# Patient Record
Sex: Female | Born: 1990 | Race: Black or African American | Hispanic: No | Marital: Single | Smoking: Never smoker
Health system: Southern US, Community
[De-identification: ages and names within clinical notes are randomized; demographics above are authoritative.]

## PROBLEM LIST (undated history)

## (undated) DIAGNOSIS — A64 Unspecified sexually transmitted disease: Secondary | ICD-10-CM

## (undated) DIAGNOSIS — O1490 Unspecified pre-eclampsia, unspecified trimester: Secondary | ICD-10-CM

## (undated) HISTORY — DX: Unspecified pre-eclampsia, unspecified trimester: O14.90

## (undated) HISTORY — PX: NO PAST SURGERIES: SHX2092

## (undated) HISTORY — DX: Unspecified sexually transmitted disease: A64

---
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
## 2013-10-13 IMAGING — US US OB US >=[ID] SNGL FETUS
1 series · 14 of 28 positions shown · non-contrast
Comparison: none

REASON FOR EXAM: dating
COMMENTS:

[Series 1: us ob us >=(id) sngl fetus · 0.39mm/px · 14 of 62 slices shown]
[im 3/62]
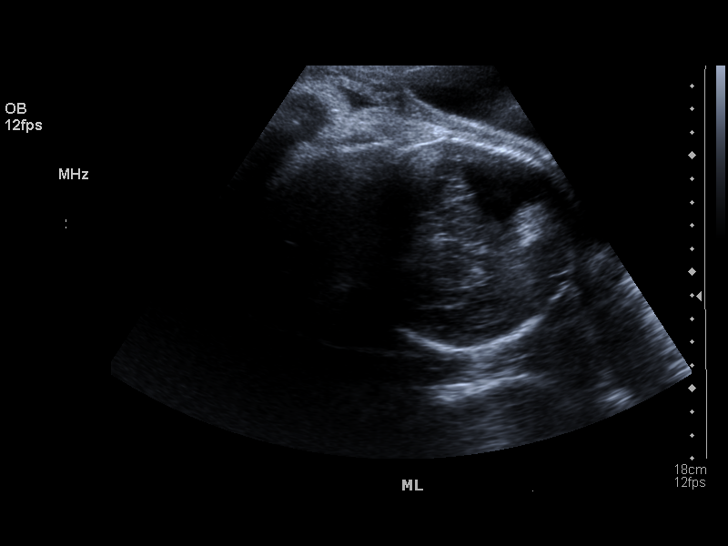
[im 7/62]
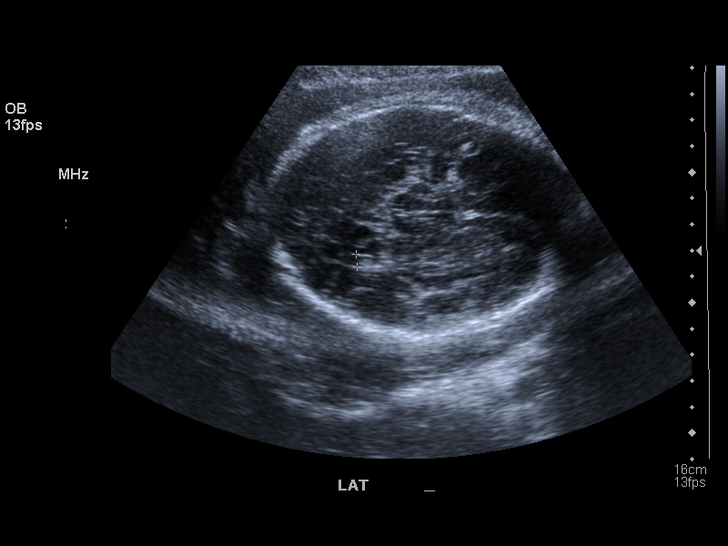
[im 12/62]
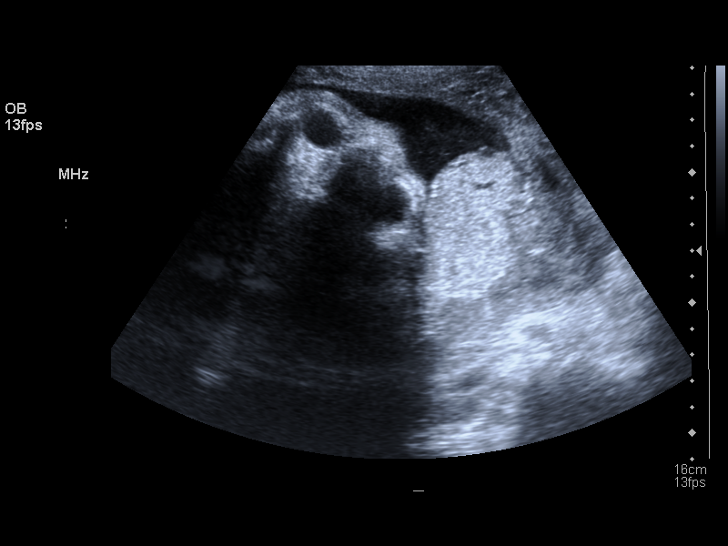
[im 16/62]
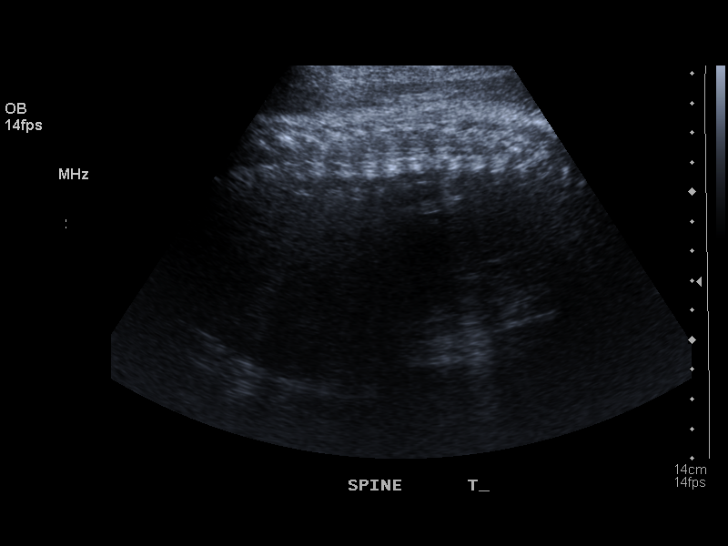
[im 21/62]
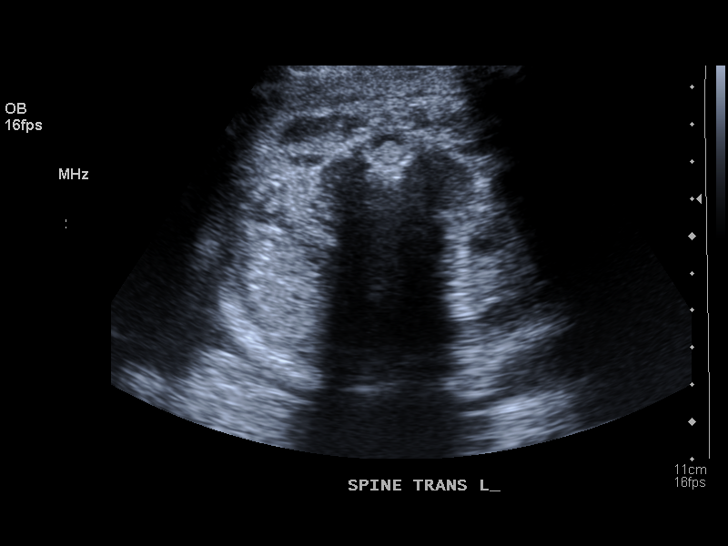
[im 25/62]
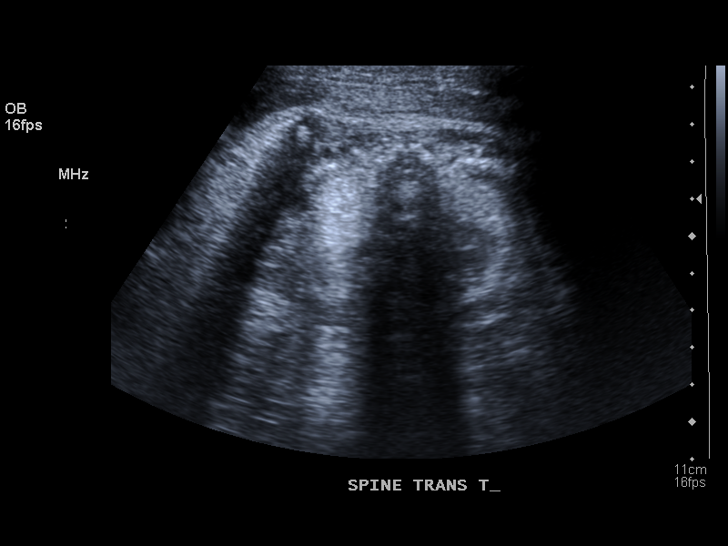
[im 30/62]
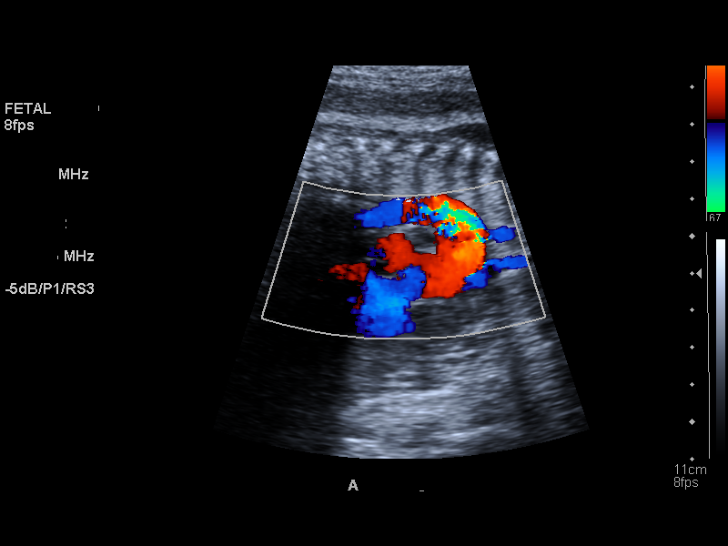
[im 34/62]
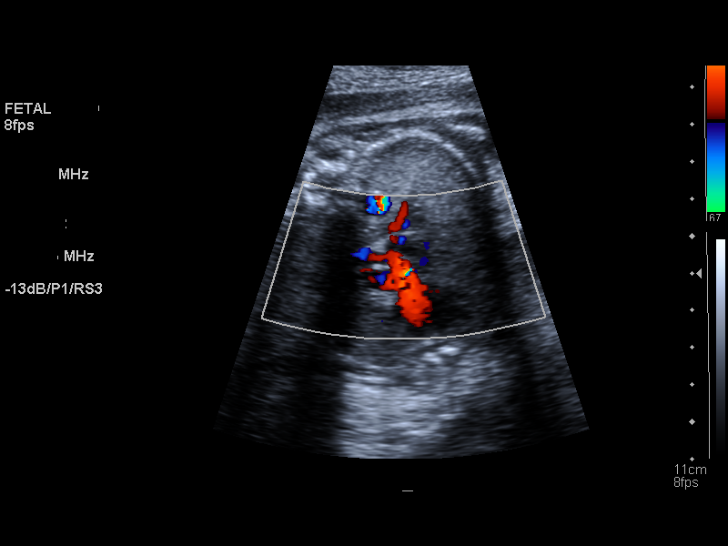
[im 39/62]
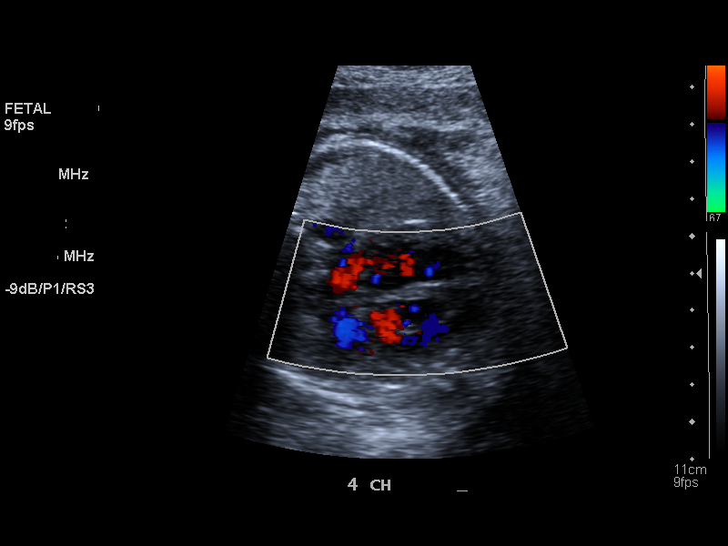
[im 43/62]
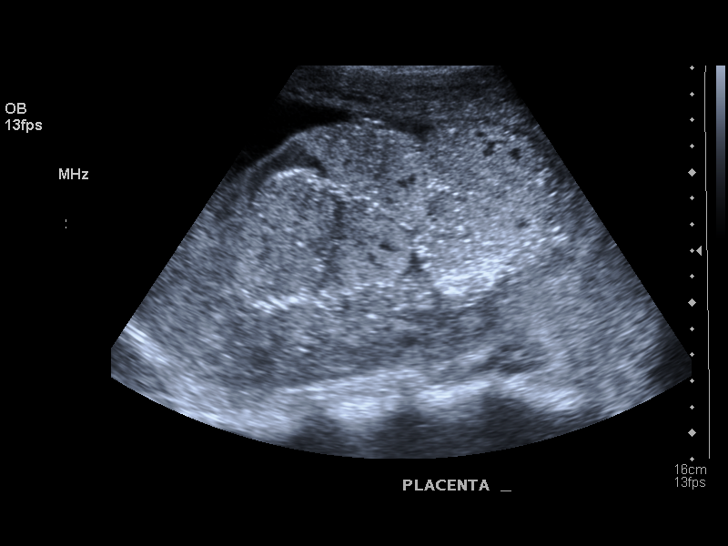
[im 48/62]
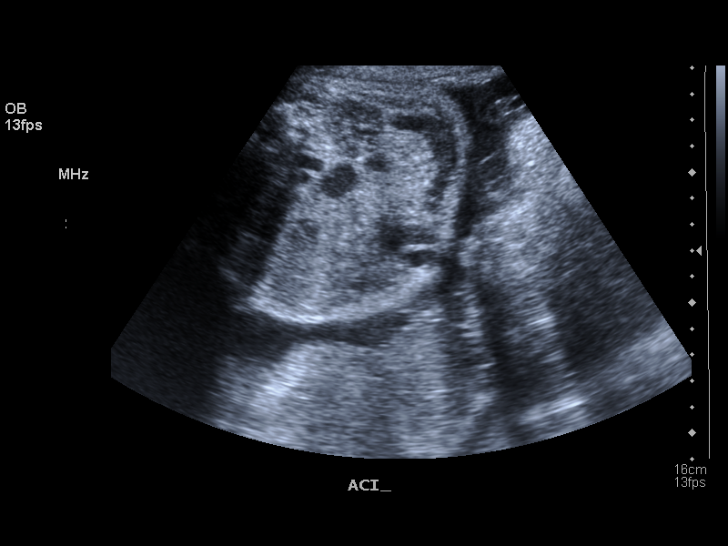
[im 52/62]
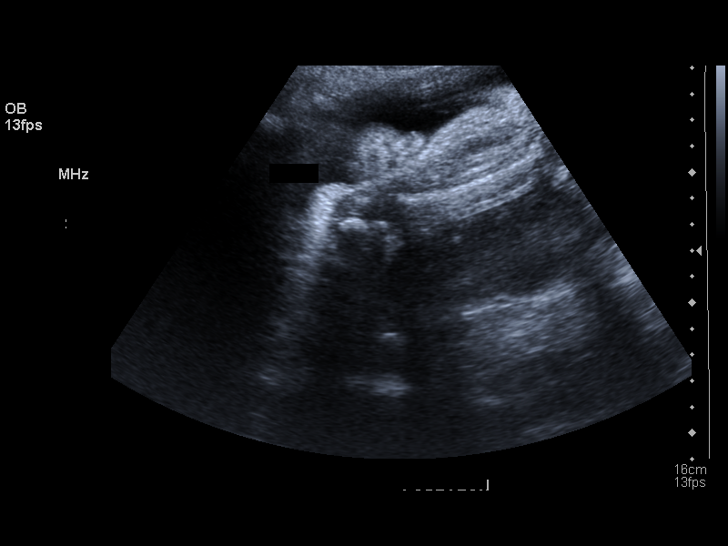
[im 57/62]
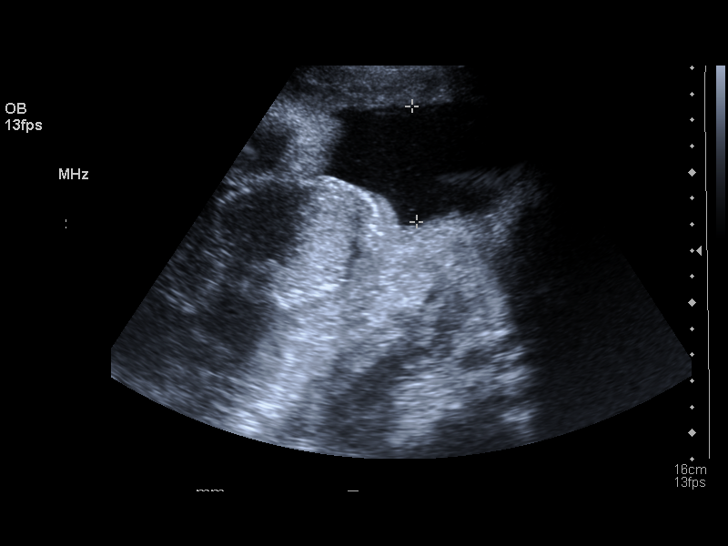
[im 62/62]
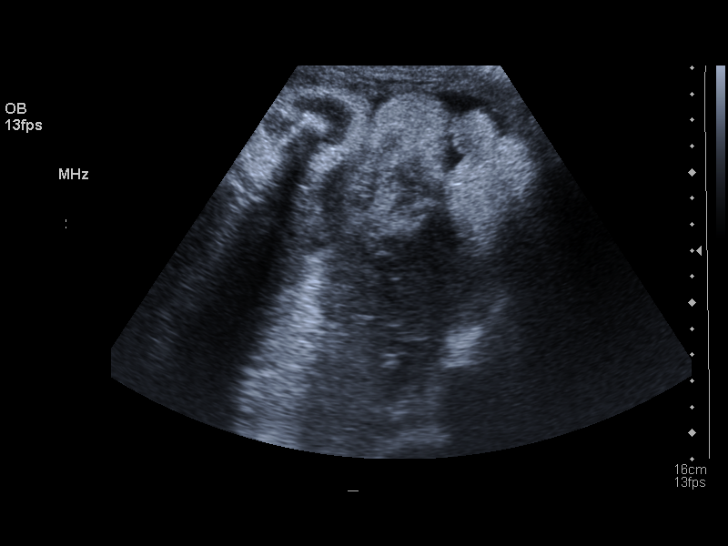

[14 of 28 positions shown; findings below may reference images not displayed]

PROCEDURE:     US  - US OB GREATER/OR EQUAL TO [id number]  - December 03, 2011  [DATE]

RESULT:

OB ultrasound, utilizing routine protocol, shows the cervix is closed and
measures 5.07 cm in length. The distance from the lower margin of the
posterior placenta to the internal cervical os is 9.0 cm. The placenta is
posterior and Grade II in appearance. A single fetus is demonstrated with
trunk, extremity, diaphragmatic and heart motion demonstrated. Fetal heart
rate is 141 beats per minute. Fetal anatomy appears to be within normal
limits without congenital abnormality suggested. Three vessel cord is seen.
Amniotic fluid volume is visually normal with an AFI of 15.0 cm. Current
estimated fetal weight is 2,899 grams + / - 429 grams with an ultrasound EDC
26 December, 2011 and a current gestational age of 36 weeks, 4 days.
IMPRESSION: Single, viable intrauterine fetus as described.

## 2013-10-27 IMAGING — US US OB FOLLOW-UP
1 series · 17 of 28 positions shown · non-contrast
Comparison: none

REASON FOR EXAM: follow up growth
COMMENTS:

[Series 1: us ob follow-up · 17 of 38 slices shown]
[im 1/38]
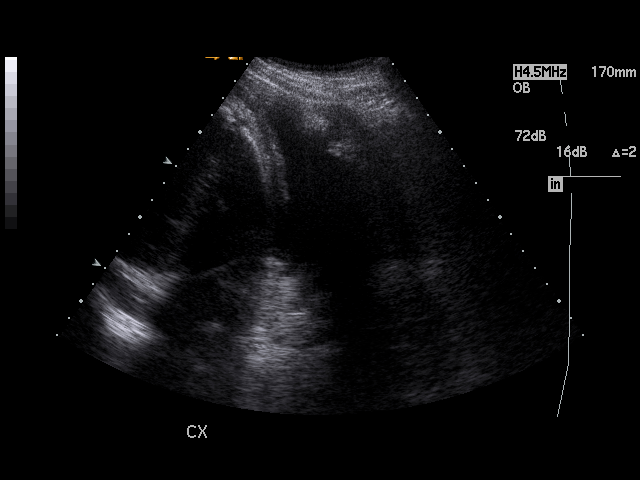
[im 3/38]
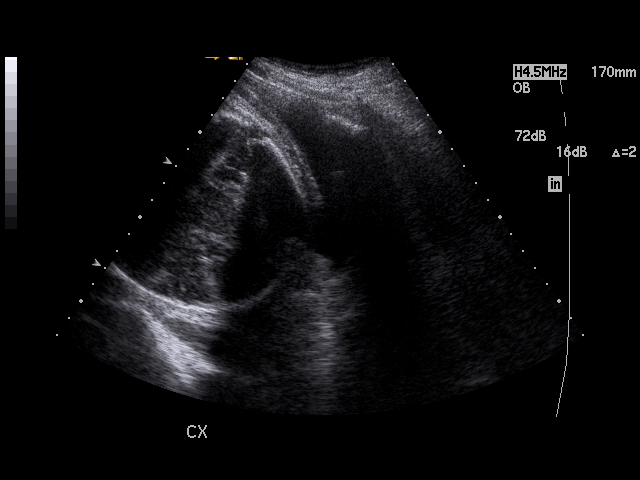
[im 6/38]
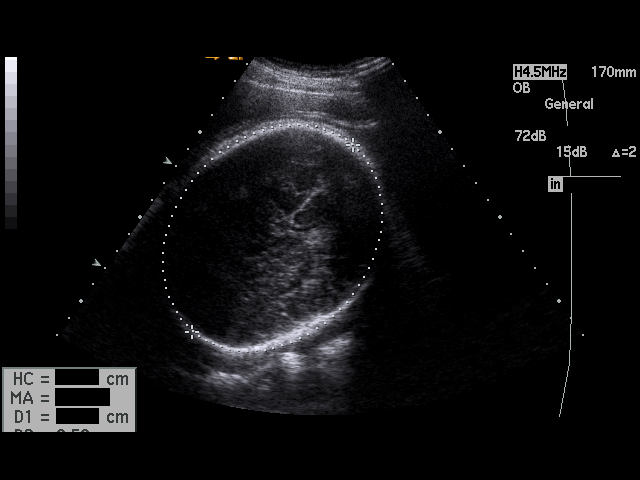
[im 7/38]
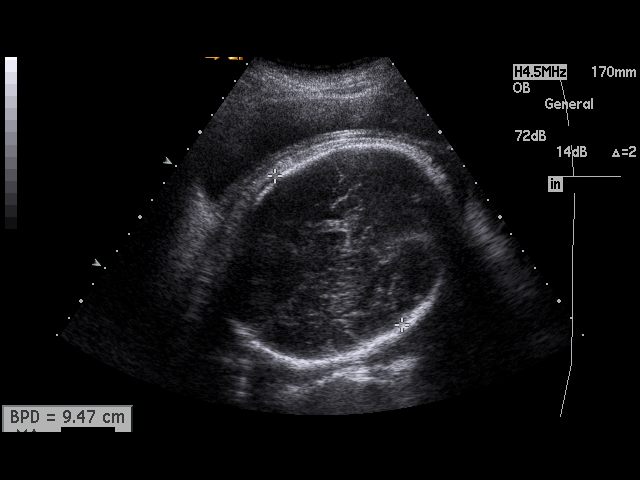
[im 10/38]
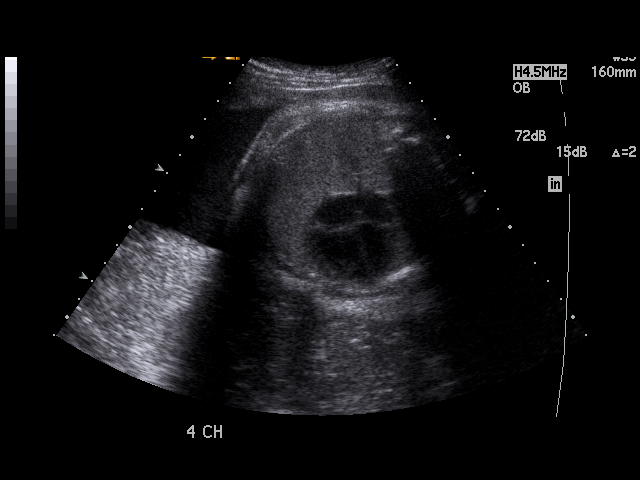
[im 13/38]
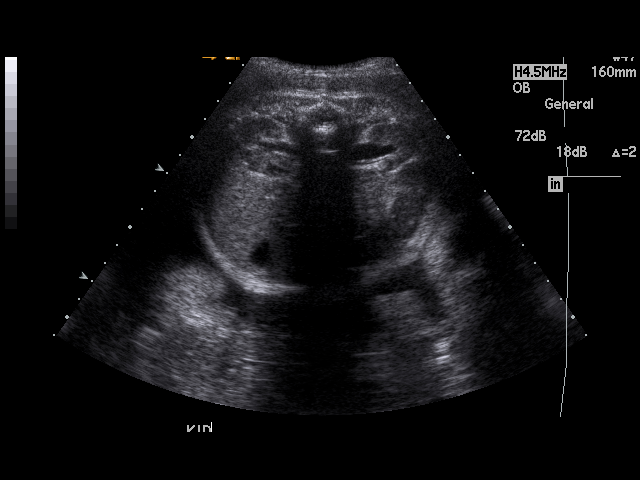
[im 14/38]
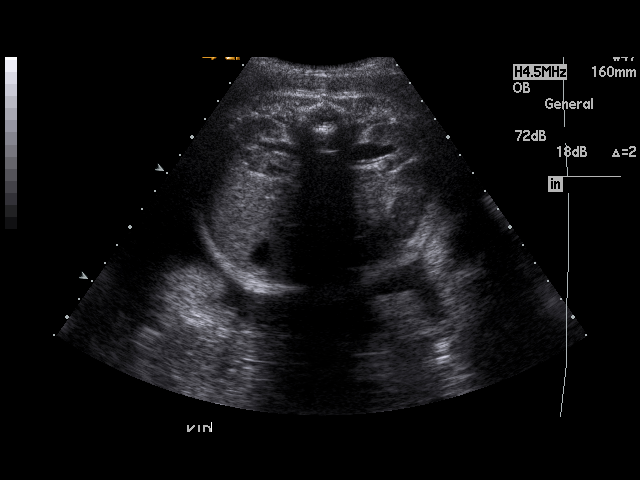
[im 17/38]
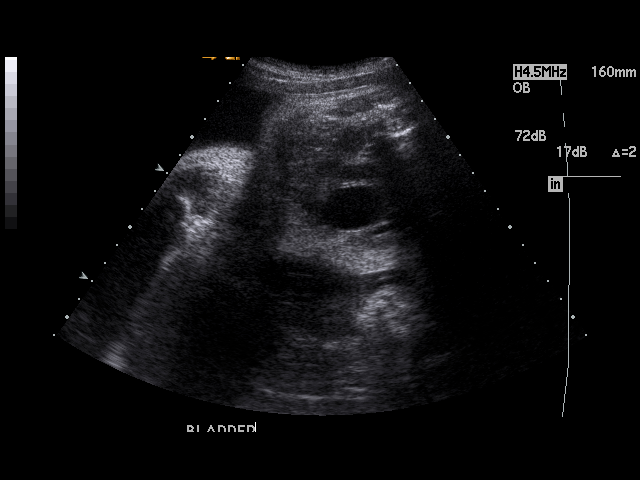
[im 20/38]
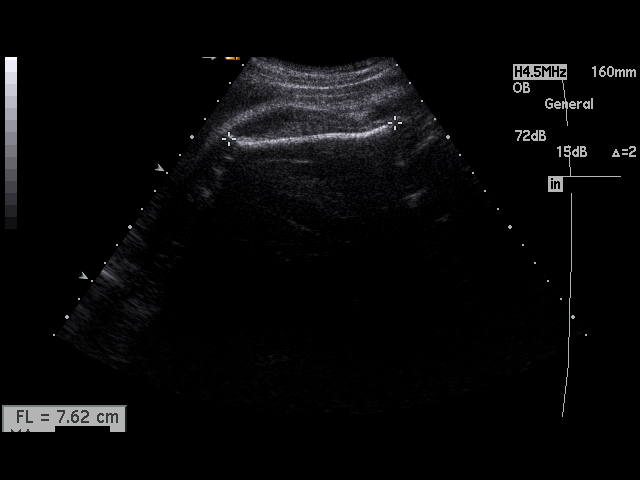
[im 21/38]
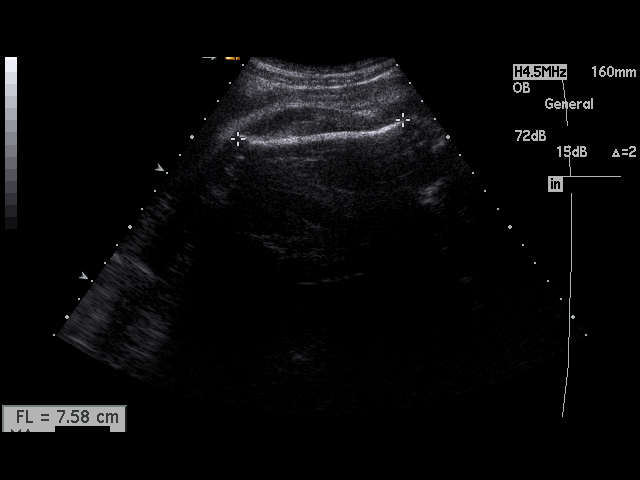
[im 24/38]
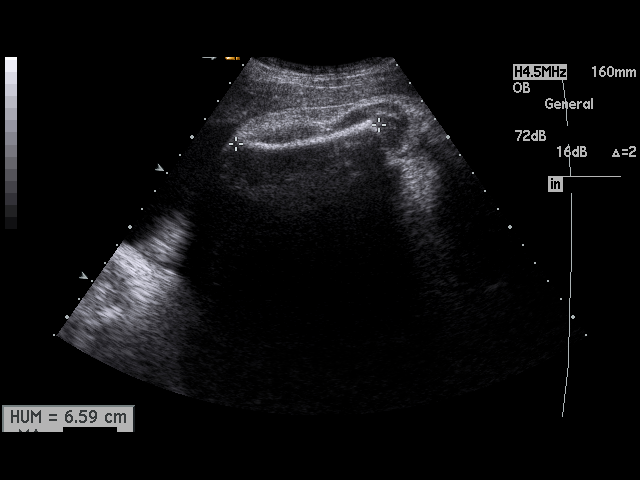
[im 25/38]
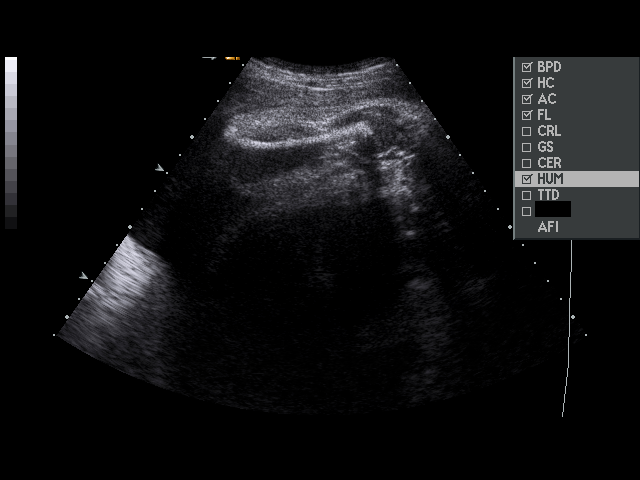
[im 28/38]
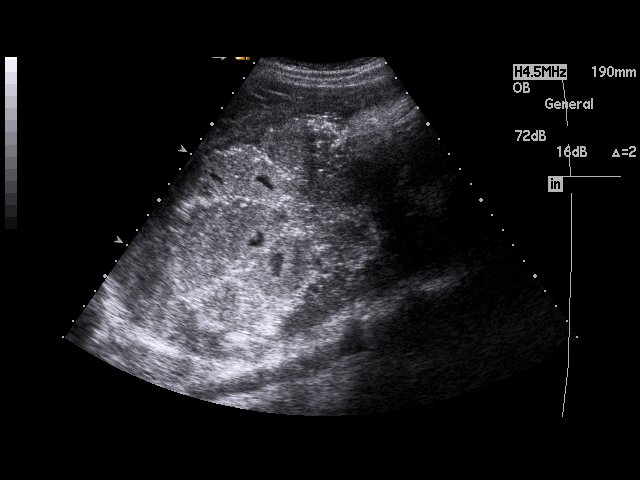
[im 31/38]
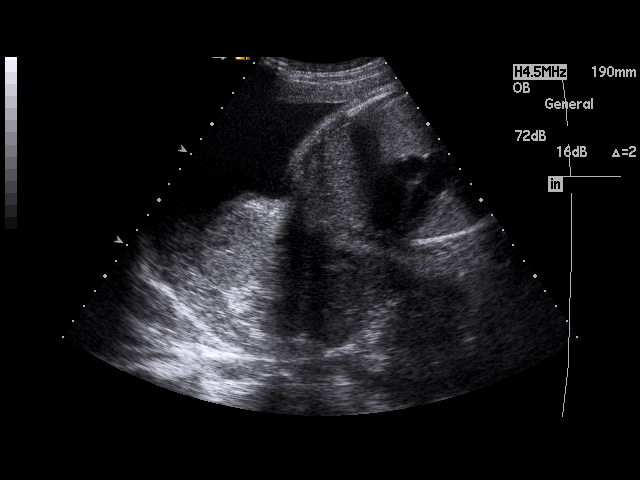
[im 32/38]
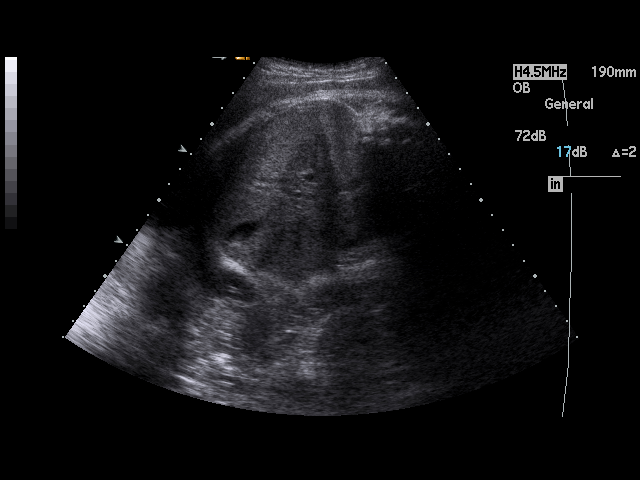
[im 35/38]
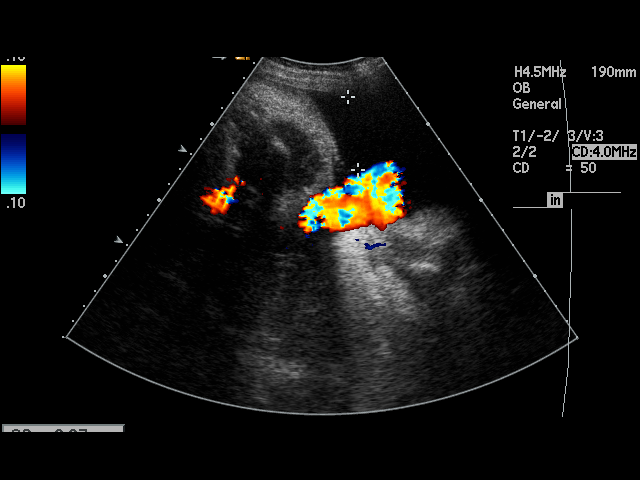
[im 38/38]
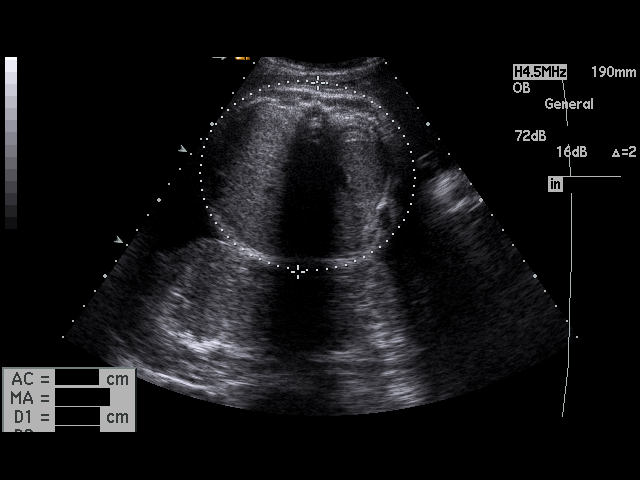

[17 of 28 positions shown; findings below may reference images not displayed]

PROCEDURE:     US  - US OB FOLLOW UP  - December 17, 2011  [DATE]

RESULT:     OB ultrasound is performed. The study shows a single
intrauterine fetus. The length of the cervix is 3.76 cm. The cervix is
closed. The placenta is posterior. The distance from the inferior aspect to
the internal cervical os was not measured. AFI is 13.78 cm. Gestational age
based on the biophysical measurements is 38 weeks 5 days + / - the standard
deviation with an ultrasound EDD December 26, 2011. Current fetal weight is
3,546 grams + / - 411 grams. There is a cephalic presentation. The fetal
bladder, kidneys and stomach are seen and appear to be normal. The placenta
is posterior and grade 3. No placenta previa is evident.
IMPRESSION: 1.     AFI measures 13.78 cm.
2.     Gestational age is 38 weeks 5 days with an ultrasound EDD [DATE]

## 2014-06-30 DIAGNOSIS — A64 Unspecified sexually transmitted disease: Secondary | ICD-10-CM

## 2014-06-30 HISTORY — DX: Unspecified sexually transmitted disease: A64

## 2015-01-22 NOTE — Consult Note (Signed)
Referral Information:   Reason for Referral Hypertension and proteinuria in late pregnancy Poor dating    Referring Physician Kate Sable, MD    Prenatal Hx 24 year old G1 with EDC of 12/26/2011 (39 0/7 weeks). Pregnancy dated by ultrasound performed on 12/03/2011. Then the calculated gestational age was 46 5/7 weeks. Patient has been admitted to the hospital for increased blood pressures. The patient report a previous uncomplicated antenatal course. She does not know the date of her LMP. The patient had her first positive pregnancy test on Feb. 20.  She was first seen for prenatal care on March 2.  On March 19, the patient was admitted for evaluation of elevated BPs of 134-148/86-90. Since her hospitalization, has BP in the same range and has had a systolic BP of 150.  Her lab evaluation has been normal except for  P/C ratio on 3/19 = 2325 (markedly high) P/C ratio on 3/20 = 1406 (confirms previous) Uric acid on 3/20 = 6.9 (extremely high for pregnancy) Creatinine on 3/20 = 0.84 (high for pregnancy) H&H = 9.7 and 28.8 (low)  The patient reports no complaints or signs/symptom of preeclampsia   Home Medications: Medication Instructions Status  Prenatal 1 oral capsule 1 cap(s) orally once a day Active   Allergies:   No Known Allergies:   Vital Signs/Notes:  Nursing Vital Signs: **Vital Signs.:   21-Mar-13 03:16   Vital Signs Type Routine   Temperature Temperature (F) 98.6   Celsius 37   Temperature Source oral   Pulse Pulse 71   Pulse source per Dinamap   Respirations Respirations 18   Systolic BP Systolic BP 137   Diastolic BP (mmHg) Diastolic BP (mmHg) 83   Mean BP 101   BP Source Dinamap   Pulse Ox % Pulse Ox % 98   Pulse Ox Activity Level  At rest   Oxygen Delivery Room Air/ 21 %   Fetal Heart Tones  138    07:19   Vital Signs Type Routine   Temperature Temperature (F) 98   Celsius 36.6   Temperature Source oral   Pulse Pulse 63   Pulse source per Dinamap    Respirations Respirations 18   Systolic BP Systolic BP 128   Diastolic BP (mmHg) Diastolic BP (mmHg) 84   Mean BP 98   BP Source Dinamap   Pulse Ox % Pulse Ox % 98   Pulse Ox Activity Level  At rest   Oxygen Delivery Room Air/ 21 %   Perinatal Consult:   Past Medical History cont'd Ob/Gyn History   Menses -- 10/irreg/7   No STDs   No previous Gyn care Surgery -- none Illness -- none Immunizations -- No recent influenza vaccine    Otherwise up to date  Social History   Tobacco/Alcohol/Drugs -- none   Single   Freshman in college  Fam Hx   Mother with anemia   Father, 2 brothers, 1 sister -- all alive and well  ROS   Corrective lenses   Otherwise all systems negative to review   Impression/Recommendations:   Impression 1) Intrauterine pregnancy at 26 0/7 weeks    (Poor dating) 2) Preeclampsia 3) Poor prenatal care 4) Anemia (I would expect H&H be higher in the setting of preeclampsia)    Recommendations 1) Labor induction 2) Magnesium Sulfate for seizure prophylaxis during induction and first 24 hours postpartum 3) Urine drug screen 4) Hemoglobin electrophoresis (if not done as an outpatient) 5) Iron studies     Total  Time Spent with Patient 30 minutes    >50% of visit spent in couseling/coordination of care yes    Office Use Only [address]  INPT Consult Problem Focused (20 min)   Coding Description: OTHER: Preeclampsia.  Electronic Signatures: Marcelino ScotBrancazio, Minas Bonser (MD)  (Signed 21-Mar-13 09:31)  Authored: Referral, Home Medications, Allergies, Vital Signs/Notes, Consult, Impression, Billing, Coding Description   Last Updated: 21-Mar-13 09:31 by Marcelino ScotBrancazio, Primo Innis (MD)
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
## 2015-04-25 ENCOUNTER — Ambulatory Visit (INDEPENDENT_AMBULATORY_CARE_PROVIDER_SITE_OTHER): Payer: Managed Care, Other (non HMO) | Admitting: Unknown Physician Specialty

## 2015-04-25 ENCOUNTER — Encounter: Payer: Self-pay | Admitting: Unknown Physician Specialty

## 2015-04-25 VITALS — BP 117/75 | HR 82 | Temp 98.5°F | Ht 63.9 in | Wt 120.8 lb

## 2015-04-25 DIAGNOSIS — J029 Acute pharyngitis, unspecified: Secondary | ICD-10-CM

## 2015-04-25 MED ORDER — AZITHROMYCIN 250 MG PO TABS: ORAL_TABLET | ORAL | Status: DC

## 2015-04-25 NOTE — Patient Instructions (Signed)

## 2015-04-25 NOTE — Progress Notes (Signed)
   BP 117/75 mmHg  Pulse 82  Temp(Src) 98.5 F (36.9 C)  Ht 5' 3.9" (1.623 m)  Wt 120 lb 12.8 oz (54.795 kg)  BMI 20.80 kg/m2  SpO2 99%  LMP 04/22/2015 (Exact Date)   Subjective:    Patient ID: Jill Ramos, female    DOB: Mar 22, 1991, 22 y.o.   MRN: [id number]  HPI: Jill Ramos is a 24 y.o. female  Chief Complaint  Patient presents with  . Sore Throat    pt states throat started feelin funny about a week ago but actually started hurting on Saturday   Sore Throat  This is a new problem. The current episode started in the past 7 days. The problem has been unchanged. The pain is worse on the right side. There has been no fever. The pain is moderate. Pertinent negatives include no congestion, coughing, ear pain or trouble swallowing.     Relevant past medical, surgical, family and social history reviewed and updated as indicated. Interim medical history since our last visit reviewed. Allergies and medications reviewed and updated.  Review of Systems  HENT: Negative for congestion, ear pain and trouble swallowing.   Respiratory: Negative for cough.     Per HPI unless specifically indicated above     Objective:    BP 117/75 mmHg  Pulse 82  Temp(Src) 98.5 F (36.9 C)  Ht 5' 3.9" (1.623 m)  Wt 120 lb 12.8 oz (54.795 kg)  BMI 20.80 kg/m2  SpO2 99%  LMP 04/22/2015 (Exact Date)  Wt Readings from Last 3 Encounters:  04/25/15 120 lb 12.8 oz (54.795 kg)  03/12/13 117 lb (53.071 kg)    Physical Exam  Constitutional: She is oriented to person, place, and time. She appears well-developed and well-nourished. No distress.  HENT:  Head: Normocephalic and atraumatic.  Right Ear: Tympanic membrane and ear canal normal.  Left Ear: Tympanic membrane and ear canal normal.  Mouth/Throat: Mucous membranes are normal. Oropharyngeal exudate, posterior oropharyngeal edema and posterior oropharyngeal erythema present. No tonsillar abscesses.  Eyes: Conjunctivae and lids are  normal. Right eye exhibits no discharge. Left eye exhibits no discharge. No scleral icterus.  Cardiovascular: Normal rate and regular rhythm.   Pulmonary/Chest: Effort normal. No respiratory distress.  Abdominal: Normal appearance. There is no splenomegaly or hepatomegaly.  Musculoskeletal: Normal range of motion.  Neurological: She is alert and oriented to person, place, and time.  Skin: Skin is intact. No rash noted. No pallor.  Psychiatric: She has a normal mood and affect. Her behavior is normal. Judgment and thought content normal.  Vitals reviewed.    Assessment & Plan:   Problem List Items Addressed This Visit    None    Visit Diagnoses    Pharyngitis    -  Primary    Relevant Orders    Strep Gp A Ag, IA W/Reflex        Follow up plan: Return if symptoms worsen or fail to improve.

## 2015-04-27 LAB — STREP GP A AG, IA W/REFLEX: Strep A Culture: NEGATIVE

## 2017-01-03 ENCOUNTER — Other Ambulatory Visit: Payer: Self-pay | Admitting: Certified Nurse Midwife

## 2017-02-03 ENCOUNTER — Other Ambulatory Visit: Payer: Self-pay | Admitting: Certified Nurse Midwife

## 2017-02-04 ENCOUNTER — Telehealth: Payer: Self-pay | Admitting: Certified Nurse Midwife

## 2017-02-04 MED ORDER — NORETHIN ACE-ETH ESTRAD-FE 1-20 MG-MCG PO TABS: 1.0000 | ORAL_TABLET | Freq: Every day | ORAL | 1 refills | Status: DC

## 2017-02-04 NOTE — Telephone Encounter (Signed)
Pt aware rx sent in. 

## 2017-02-04 NOTE — Telephone Encounter (Signed)
Patient called and scheduled her annual for June 14th, however she will need a refill on her Birth Control to last her until then.   Ph[facility] [facility] [address] [place] [phone]

## 2017-03-13 ENCOUNTER — Ambulatory Visit (INDEPENDENT_AMBULATORY_CARE_PROVIDER_SITE_OTHER): Payer: 59 | Admitting: Certified Nurse Midwife

## 2017-03-13 ENCOUNTER — Encounter: Payer: Self-pay | Admitting: Certified Nurse Midwife

## 2017-03-13 VITALS — BP 100/60 | HR 88 | Ht 63.0 in | Wt 125.0 lb

## 2017-03-13 DIAGNOSIS — Z113 Encounter for screening for infections with a predominantly sexual mode of transmission: Secondary | ICD-10-CM | POA: Diagnosis not present

## 2017-03-13 DIAGNOSIS — Z3041 Encounter for surveillance of contraceptive pills: Secondary | ICD-10-CM

## 2017-03-13 DIAGNOSIS — Z01419 Encounter for gynecological examination (general) (routine) without abnormal findings: Secondary | ICD-10-CM

## 2017-03-13 DIAGNOSIS — Z124 Encounter for screening for malignant neoplasm of cervix: Secondary | ICD-10-CM | POA: Diagnosis not present

## 2017-03-13 MED ORDER — LEVONORGESTREL-ETHINYL ESTRAD 0.15-30 MG-MCG PO TABS: 1.0000 | ORAL_TABLET | Freq: Every day | ORAL | 11 refills | Status: DC

## 2017-03-13 NOTE — Progress Notes (Signed)
Gynecology Annual Exam  PCP: Gabriel CirriWicker, Cheryl, NP  Chief Complaint:  Chief Complaint  Patient presents with  . Gynecologic Exam    History of Present Illness:Jill Ramos is a 26 year old African American/Black female, G1 P1001, who presents for her annual exam. She is having problems with BTB on her pills.   Her menses are regular and her LMP was 02/28/2017. They occur every 1 month, they last 5-6 days, are medium flow, and are without clots.   When she misses pills, she will start bleeding and continue spotting until she is through her placebo pills.  She denies dysmenorrhea.  The patient's past medical history is notable for a history of being treated for Chlamydia. Since her last annual GYN exam dated 11/10/2015 , she has had no significant changes in her health history.  She is sexually active. She is currently using Junel 1/20 and condoms for contraception.  Her last Pap smear 11/10/15 was negative.  Mammogram is not applicable.  There is no family history of breast cancer.  There is no family history of ovarian cancer.  The patient does do monthly self breast exams.  The patient does not smoke.  The patient does not drink alcohol.  The patient does not use illegal drugs.  The patient exercises regularly by walking  The patient does get adequate calcium in her diet.  She has not had a recent cholesterol screen and is not interested in labwork.   The patient denies current symptoms of depression.    Review of Systems: Review of Systems  Constitutional: Negative for chills, fever and weight loss.  HENT: Negative for congestion, sinus pain and sore throat.   Eyes: Negative for blurred vision and pain.  Respiratory: Negative for hemoptysis, shortness of breath and wheezing.   Cardiovascular: Negative for chest pain, palpitations and leg swelling.  Gastrointestinal: Negative for abdominal pain, blood in stool, diarrhea, heartburn, nausea and vomiting.  Genitourinary:  Negative for dysuria, frequency, hematuria and urgency.  Musculoskeletal: Negative for back pain, joint pain and myalgias.  Skin: Negative for itching and rash.  Neurological: Negative for dizziness, tingling and headaches.  Endo/Heme/Allergies: Negative for environmental allergies and polydipsia. Does not bruise/bleed easily.       Negative for hirsutism   Psychiatric/Behavioral: Negative for depression. The patient is not nervous/anxious and does not have insomnia.     Past Medical History:  Past Medical History:  Diagnosis Date  . Preeclampsia   . STD (sexually transmitted disease) 06/2014   chlamydia    Past Surgical History:  Past Surgical History:  Procedure Laterality Date  . NO PAST SURGERIES      Family History:  Family History  Problem Relation Age of Onset  . Diabetes Maternal Grandmother   . Hypertension Maternal Grandmother   . Diabetes Paternal Grandmother   . Heart disease Paternal Grandfather   . Stomach cancer Cousin     Social History:  Social History   Social History  . Marital status: Single    Spouse name: N/A  . Number of children: 1  . Years of education: N/A   Occupational History  . Transport plannerales Manager    Social History Main Topics  . Smoking status: Never Smoker  . Smokeless tobacco: Never Used  . Alcohol use No  . Drug use: No  . Sexual activity: Yes    Partners: Male    Birth control/ protection: Pill   Other Topics Concern  . Not on file  Social History Narrative  . No narrative on file    Allergies:  No Known Allergies  Medications: Prior to Admission medications   Medication Sig Start Date End Date Taking? Authorizing Provider  norethindrone-ethinyl estradiol (JUNEL FE 1/20) 1-20 MG-MCG tablet Take 1 tablet by mouth daily. 02/04/17  Yes Farrel Conners, CNM    Physical Exam Vitals: Blood pressure 100/60, pulse 88, height 5\' 3"  (1.6 m), weight 125 lb (56.7 kg), last menstrual period 02/28/2017.  General: NAD HEENT:  normocephalic, anicteric Neck: no thyroid enlargement, no palpable nodules, no cervical lymphadenopathy  Pulmonary: No increased work of breathing, CTAB Cardiovascular: RRR, without murmur  Breast: Breast symmetrical, no tenderness, no palpable nodules or masses, no skin or nipple retraction present, no nipple discharge.  No axillary, infraclavicular or supraclavicular lymphadenopathy. Abdomen: Soft, non-tender, non-distended.  Umbilicus without lesions.  No hepatomegaly or masses palpable. No evidence of hernia. Genitourinary:  External: Normal external female genitalia.  Normal urethral meatus, normal Bartholin's and Skene's glands.    Vagina: Normal vaginal mucosa, no evidence of prolapse.    Cervix: Grossly normal in appearance, no bleeding, non-tender  Uterus: Anteflexeded, normal size, shape, and consistency, mobile, and non-tender  Adnexa: No adnexal masses, non-tender  Rectal: deferred  Lymphatic: no evidence of inguinal lymphadenopathy Extremities: no edema, erythema, or tenderness Neurologic: Grossly intact Psychiatric: mood appropriate, affect full     Assessment: 26 y.o. G1P1001 well woman exam BTB on pills  Discussed finding a better way or time to take the pill so she is not forgetting  Will switch to another pill-Portia-and see if it is a little more forgiving. Has been off the pills for a couple weeks (ran out). Discussed using condoms x 2 weeks  Plan:  1) Breast cancer screening - recommend monthly self breast exam.   2) STI screening was offered and accepted.  3) Cervical cancer screening - Pap was done. ASCCP guidelines and rational discussed.  Patient opts for yearly screening interval  4) Routine healthcare maintenance including cholesterol and diabetes screening declined   Farrel Conners, CNM

## 2017-03-16 LAB — PAP IG, CT-NG, RFX HPV ALL
Chlamydia, Nuc. Acid Amp: NEGATIVE
Gonococcus by Nucleic Acid Amp: NEGATIVE
PAP SMEAR COMMENT: 0

## 2017-03-17 ENCOUNTER — Encounter: Payer: Self-pay | Admitting: Certified Nurse Midwife

## 2017-03-17 DIAGNOSIS — Z8759 Personal history of other complications of pregnancy, childbirth and the puerperium: Secondary | ICD-10-CM | POA: Insufficient documentation

## 2017-03-17 DIAGNOSIS — Z309 Encounter for contraceptive management, unspecified: Secondary | ICD-10-CM | POA: Insufficient documentation

## 2017-04-03 ENCOUNTER — Other Ambulatory Visit: Payer: Self-pay | Admitting: Certified Nurse Midwife

## 2017-05-14 ENCOUNTER — Telehealth: Payer: Self-pay | Admitting: Unknown Physician Specialty

## 2017-05-14 NOTE — Telephone Encounter (Signed)
Patient dropped off a form for Jill MaxwellCheryl to fill out.  I put in Jill Ramos's basket but if she needs an appointment for this let me know.  Thank you  [phone]

## 2017-05-14 NOTE — Telephone Encounter (Signed)
Patient needs to be seen. She has not been seen in 2 years.

## 2017-05-15 NOTE — Telephone Encounter (Signed)
Called patient to get appointment scheduled.  VM was full could not leave a message.  I will try to reach her again.

## 2018-01-01 ENCOUNTER — Other Ambulatory Visit: Payer: Self-pay | Admitting: Certified Nurse Midwife

## 2018-03-25 ENCOUNTER — Other Ambulatory Visit: Payer: Self-pay | Admitting: Certified Nurse Midwife

## 2018-05-17 NOTE — Progress Notes (Signed)
Gynecology Annual Exam  PCP: Gabriel CirriWicker, Cheryl, NP  Chief Complaint:  Chief Complaint  Patient presents with  . Gynecologic Exam    period starts on 4th day of plecebos - nl? okay?    History of Present Illness:Jill Ramos is a 27 year old African American/Black female, G1 P1001, who presents for her annual exam. She is wondering about her delay in starting her menses until the 4th day of placebos. She was advised that this OK.   Her menses are regular and her LMP was 11 August 19 . They occur every 1 month, they last 6-7days, are medium flow, and are without clots. She reports that she does not start her new pack of pills the day after her last placebo pill. She has been waiting until her bleeding stops before starting her new pill pack. She occasionally has dysmenorrhea but does not usually need any medication for pain.  The patient's past medical history is notable for a history of being treated for Chlamydia. Since her last annual GYN exam dated 03/13/17 , she has had no significant changes in her health history. 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 She is sexually active. She is currently using Lillow OCPs and condoms for contraception.  Her last Pap smear 03/13/2017 was negative.  Mammogram is not applicable.  There is no family history of breast cancer.  There is no family history of ovarian cancer.  The patient does do monthly self breast exams.  The patient does not smoke.  The patient does not drink alcohol.  The patient does not use illegal drugs.  The patient exercises occasionally by walking  The patient does get adequate calcium in her diet.  She has not had a recent cholesterol screen and is not interested in labwork.   The patient denies current symptoms of depression.    Review of Systems: Review of Systems  Constitutional: Negative for chills, fever and weight loss.  HENT: Negative for congestion, sinus pain and sore  throat.   Eyes: Negative for blurred vision and pain.  Respiratory: Negative for hemoptysis, shortness of breath and wheezing.   Cardiovascular: Negative for chest pain, palpitations and leg swelling.  Gastrointestinal: Negative for abdominal pain, blood in stool, diarrhea, heartburn, nausea and vomiting.  Genitourinary: Negative for dysuria, frequency, hematuria and urgency.  Musculoskeletal: Negative for back pain, joint pain and myalgias.  Skin: Negative for itching and rash.  Neurological: Negative for dizziness, tingling and headaches.  Endo/Heme/Allergies: Negative for environmental allergies and polydipsia. Does not bruise/bleed easily.       Negative for hirsutism   Psychiatric/Behavioral: Negative for depression. The patient is not nervous/anxious and does not have insomnia.     Past Medical History:  Past Medical History:  Diagnosis Date  . Preeclampsia   . STD (sexually transmitted disease) 06/2014   chlamydia    Past Surgical History:  Past Surgical History:  Procedure Laterality Date  . NO PAST SURGERIES      Family History:  Family History  Problem Relation Age of Onset  . Diabetes Maternal Grandmother   . Hypertension Maternal Grandmother   . Diabetes Paternal Grandmother   . Heart disease Paternal Grandfather   . Stomach cancer Cousin     Social History:  Social History   Socioeconomic History  . Marital status: Single    Spouse name: Not on file  . Number of children: 1  . Years of education: 1916  .  Highest education level: Not on file  Occupational History  . Occupation: case worker    Comment: [employer]  Social Needs  . Financial resource strain: Not on file  . Food insecurity:    Worry: Not on file    Inability: Not on file  . Transportation needs:    Medical: Not on file    Non-medical: Not on file  Tobacco Use  . Smoking status: Never Smoker  . Smokeless tobacco: Never Used  Substance and Sexual Activity  . Alcohol use: No     Alcohol/week: 0.0 standard drinks  . Drug use: No  . Sexual activity: Yes    Partners: Male    Birth control/protection: Pill  Lifestyle  . Physical activity:    Days per week: Not on file    Minutes per session: Not on file  . Stress: Not on file  Relationships  . Social connections:    Talks on phone: Not on file    Gets together: Not on file    Attends religious service: Not on file    Active member of club or organization: Not on file    Attends meetings of clubs or organizations: Not on file    Relationship status: Not on file  . Intimate partner violence:    Fear of current or ex partner: Not on file    Emotionally abused: Not on file    Physically abused: Not on file    Forced sexual activity: Not on file  Other Topics Concern  . Not on file  Social History Narrative  . Not on file    Allergies:  No Known Allergies  Medications:  Current Outpatient Medications:  .  levonorgestrel-ethinyl estradiol (LILLOW) 0.15-30 MG-MCG tablet, Take 1 tablet by mouth daily., Disp: 84 tablet, Rfl: 3   Physical Exam Vitals:BP 110/70   Pulse 72   Ht 5\' 3"  (1.6 m)   Wt 135 lb 4 oz (61.3 kg)   LMP 05/10/2018   BMI 23.96 kg/m     General: BF in NAD HEENT: normocephalic, anicteric Neck: no thyroid enlargement, no palpable nodules, no cervical lymphadenopathy  Pulmonary: No increased work of breathing, CTAB Cardiovascular: RRR, without murmur  Breast: Breast symmetrical, no tenderness, no palpable nodules or masses, no skin or nipple retraction present, no nipple discharge.  No axillary, infraclavicular or supraclavicular lymphadenopathy. Abdomen: Soft, non-tender, non-distended.  Umbilicus without lesions.  No hepatomegaly or masses palpable. No evidence of hernia. Genitourinary:  External: Normal external female genitalia.  Normal urethral meatus, normal Bartholin's and Skene's glands.    Vagina: Normal vaginal mucosa, no evidence of prolapse.    Cervix: Grossly normal in  appearance, no bleeding, non-tender  Uterus: Anteflexeded, dextrorotated, normal size, shape, and consistency, mobile, and non-tender  Adnexa: No adnexal masses, non-tender  Rectal: deferred  Lymphatic: no evidence of inguinal lymphadenopathy Extremities: no edema, erythema, or tenderness Neurologic: Grossly intact Psychiatric: mood appropriate, affect full     Assessment: 27 y.o. G1P1001 well woman exam Not taking pills correctly   Plan:  1) Breast cancer screening - recommend monthly self breast exam.   2) STI screening was offered and accepted.  3) Cervical cancer screening - Pap was done. ASCCP guidelines and rational discussed.  Patient opts for yearly screening interval  4) Routine healthcare maintenance including cholesterol and diabetes screening declined   5) Refill Lillow #3packs/RF x 3  6) RTO 1 year and prn.  Farrel Connersolleen Tomasa Dobransky, CNM

## 2018-05-18 ENCOUNTER — Ambulatory Visit (INDEPENDENT_AMBULATORY_CARE_PROVIDER_SITE_OTHER): Payer: 59 | Admitting: Certified Nurse Midwife

## 2018-05-18 ENCOUNTER — Encounter: Payer: Self-pay | Admitting: Certified Nurse Midwife

## 2018-05-18 ENCOUNTER — Other Ambulatory Visit (HOSPITAL_COMMUNITY)
Admission: RE | Admit: 2018-05-18 | Discharge: 2018-05-18 | Disposition: A | Discharge status: Home / Self Care | Payer: 59 | Source: Ambulatory Visit | Attending: Certified Nurse Midwife | Admitting: Certified Nurse Midwife

## 2018-05-18 VITALS — BP 110/70 | HR 72 | Ht 63.0 in | Wt 135.2 lb

## 2018-05-18 DIAGNOSIS — Z113 Encounter for screening for infections with a predominantly sexual mode of transmission: Secondary | ICD-10-CM | POA: Insufficient documentation

## 2018-05-18 DIAGNOSIS — Z3041 Encounter for surveillance of contraceptive pills: Secondary | ICD-10-CM

## 2018-05-18 DIAGNOSIS — Z124 Encounter for screening for malignant neoplasm of cervix: Secondary | ICD-10-CM | POA: Diagnosis present

## 2018-05-18 DIAGNOSIS — Z01419 Encounter for gynecological examination (general) (routine) without abnormal findings: Secondary | ICD-10-CM

## 2018-05-18 MED ORDER — LEVONORGESTREL-ETHINYL ESTRAD 0.15-30 MG-MCG PO TABS: 1.0000 | ORAL_TABLET | Freq: Every day | ORAL | 3 refills | Status: DC

## 2018-05-20 LAB — CYTOLOGY - PAP
Chlamydia: NEGATIVE
Diagnosis: NEGATIVE
Neisseria Gonorrhea: NEGATIVE

## 2019-03-29 ENCOUNTER — Other Ambulatory Visit: Payer: Self-pay | Admitting: Certified Nurse Midwife

## 2019-05-23 NOTE — Progress Notes (Signed)
Gynecology Annual Exam  PCP: Kathrine Haddock, NP  Chief Complaint:  Chief Complaint  Patient presents with  . Gynecologic Exam    History of Present Illness:Jill Ramos is a 28 year old African American/Black female, G1 P1001, who presents for her annual exam.    Her menses are regular and her LMP was 07 May 2019. They occur every 1 month, they last 4-5 days, are medium flow, and are without clots. She reports her menses has shortened since starting to take the pills correctly (was waiting to start a new pack till her bleeding stopped). She denies dysmenorrhea. The patient's past medical history is notable for a history of being treated for Chlamydia. Since her last annual GYN exam dated 05/18/2018 , she has had no significant changes in her health history. 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 She is sexually active. She is currently using [place] OCPs for contraception.  Her last Pap smear 05/18/2018 was negative.  Mammogram is not applicable.  There is no family history of breast cancer.  There is no family history of ovarian cancer.  The patient does do monthly self breast exams.  The patient does not smoke.  The patient does not drink alcohol.  The patient does not use illegal drugs.  The patient exercises occasionally by walking 30 minutes four times a week.  The patient does get adequate calcium in her diet.  She has not had a recent cholesterol screen and is not interested in labwork.   The patient denies current symptoms of depression.    Review of Systems: Review of Systems  Constitutional: Negative for chills, fever and weight loss.  HENT: Negative for congestion, sinus pain and sore throat.   Eyes: Negative for blurred vision and pain.  Respiratory: Negative for hemoptysis, shortness of breath and wheezing.   Cardiovascular: Negative for chest pain, palpitations and leg swelling.  Gastrointestinal: Negative for abdominal pain,  blood in stool, diarrhea, heartburn, nausea and vomiting.  Genitourinary: Negative for dysuria, frequency, hematuria and urgency.  Musculoskeletal: Negative for back pain, joint pain and myalgias.  Skin: Negative for itching and rash.  Neurological: Negative for dizziness, tingling and headaches.  Endo/Heme/Allergies: Negative for environmental allergies and polydipsia. Does not bruise/bleed easily.       Negative for hirsutism   Psychiatric/Behavioral: Negative for depression. The patient is not nervous/anxious and does not have insomnia.     Past Medical History:  Past Medical History:  Diagnosis Date  . Preeclampsia   . STD (sexually transmitted disease) 06/2014   chlamydia    Past Surgical History:  Past Surgical History:  Procedure Laterality Date  . NO PAST SURGERIES      Family History:  Family History  Problem Relation Age of Onset  . Diabetes Maternal Grandmother   . Hypertension Maternal Grandmother   . Diabetes Paternal Grandmother   . Heart disease Paternal Grandfather   . Stomach cancer Cousin     Social History:  Social History   Socioeconomic History  . Marital status: Single    Spouse name: Not on file  . Number of children: 1  . Years of education: 35  . Highest education level: Not on file  Occupational History  . Occupation: case worker    Comment: [employer]  Social Needs  . Financial resource strain: Not on file  . Food insecurity    Worry: Not on file    Inability: Not on file  .  Transportation needs    Medical: Not on file    Non-medical: Not on file  Tobacco Use  . Smoking status: Never Smoker  . Smokeless tobacco: Never Used  Substance and Sexual Activity  . Alcohol use: No    Alcohol/week: 0.0 standard drinks  . Drug use: No  . Sexual activity: Yes    Partners: Male    Birth control/protection: Pill  Lifestyle  . Physical activity    Days per week: Not on file    Minutes per session: Not on file  . Stress: Not on file   Relationships  . Social Musicianconnections    Talks on phone: Not on file    Gets together: Not on file    Attends religious service: Not on file    Active member of club or organization: Not on file    Attends meetings of clubs or organizations: Not on file    Relationship status: Not on file  . Intimate partner violence    Fear of current or ex partner: Not on file    Emotionally abused: Not on file    Physically abused: Not on file    Forced sexual activity: Not on file  Other Topics Concern  . Not on file  Social History Narrative  . Not on file    Allergies:  No Known Allergies  Medications:  Current Outpatient Medications:  .  LILLOW 0.15-30 MG-MCG tablet, TAKE 1 TABLET BY MOUTH EVERY DAY, Disp: 84 tablet, Rfl: 0   Physical Exam Vitals:BP 110/70   Pulse 82   Ht 5\' 3"  (1.6 m)   Wt 144 lb 6.4 oz (65.5 kg)   LMP 05/07/2019 (Exact Date)   BMI 25.58 kg/m     General: BF in NAD HEENT: normocephalic, anicteric Neck: no thyroid enlargement, no palpable nodules, no cervical lymphadenopathy  Pulmonary: No increased work of breathing, CTAB Cardiovascular: RRR, without murmur  Breast: Breast symmetrical, no tenderness. There is some nodularity in the inner parts of both breasts, but no dominant masses. No skin or nipple retraction present, no nipple discharge.  No axillary, infraclavicular or supraclavicular lymphadenopathy. Abdomen: Soft, non-tender, non-distended.  Umbilicus without lesions.  No hepatomegaly or masses palpable. No evidence of hernia. Genitourinary:  External: Normal external female genitalia.  Normal urethral meatus, normal Bartholin's and Skene's glands.    Vagina: Normal vaginal mucosa, no evidence of prolapse, white creamy discharge.    Cervix: everted, no bleeding, non-tender  Uterus: Anteflexed, normal size, shape, and consistency, mobile, and non-tender  Adnexa: No adnexal masses, non-tender  Rectal: deferred  Lymphatic: no evidence of inguinal  lymphadenopathy Extremities: no edema, erythema, or tenderness Neurologic: Grossly intact Psychiatric: mood appropriate, affect full     Assessment: 28 y.o. G1P1001 well woman exam    Plan:  1) Breast cancer screening - recommend monthly self breast exam.   2) STI screening was offered and accepted.  3) Cervical cancer screening - Pap was done. ASCCP guidelines and rational discussed.  Patient opts for yearly screening interval  4) Routine healthcare maintenance including cholesterol and diabetes screening declined   5) Refill Lillow #3packs/RF x 3  6) RTO 1 year and prn.  Farrel Connersolleen Raeshawn Tafolla, CNM

## 2019-05-24 ENCOUNTER — Other Ambulatory Visit: Payer: Self-pay

## 2019-05-24 ENCOUNTER — Other Ambulatory Visit (HOSPITAL_COMMUNITY)
Admission: RE | Admit: 2019-05-24 | Discharge: 2019-05-24 | Disposition: A | Discharge status: Home / Self Care | Payer: Managed Care, Other (non HMO) | Source: Ambulatory Visit | Attending: Certified Nurse Midwife | Admitting: Certified Nurse Midwife

## 2019-05-24 ENCOUNTER — Ambulatory Visit (INDEPENDENT_AMBULATORY_CARE_PROVIDER_SITE_OTHER): Payer: 59 | Admitting: Certified Nurse Midwife

## 2019-05-24 ENCOUNTER — Encounter: Payer: Self-pay | Admitting: Certified Nurse Midwife

## 2019-05-24 VITALS — BP 110/70 | HR 82 | Ht 63.0 in | Wt 144.4 lb

## 2019-05-24 DIAGNOSIS — Z01411 Encounter for gynecological examination (general) (routine) with abnormal findings: Secondary | ICD-10-CM | POA: Diagnosis present

## 2019-05-24 DIAGNOSIS — Z124 Encounter for screening for malignant neoplasm of cervix: Secondary | ICD-10-CM | POA: Diagnosis present

## 2019-05-24 DIAGNOSIS — Z113 Encounter for screening for infections with a predominantly sexual mode of transmission: Secondary | ICD-10-CM | POA: Diagnosis present

## 2019-05-24 DIAGNOSIS — Z01419 Encounter for gynecological examination (general) (routine) without abnormal findings: Secondary | ICD-10-CM | POA: Diagnosis not present

## 2019-05-24 DIAGNOSIS — Z3041 Encounter for surveillance of contraceptive pills: Secondary | ICD-10-CM

## 2019-05-24 MED ORDER — LEVONORGESTREL-ETHINYL ESTRAD 0.15-30 MG-MCG PO TABS: 1.0000 | ORAL_TABLET | Freq: Every day | ORAL | 3 refills | Status: DC

## 2019-05-25 LAB — CYTOLOGY - PAP
Chlamydia: NEGATIVE
Diagnosis: NEGATIVE
Neisseria Gonorrhea: NEGATIVE

## 2019-09-20 ENCOUNTER — Telehealth: Payer: Self-pay | Admitting: Unknown Physician Specialty

## 2019-09-20 ENCOUNTER — Ambulatory Visit: Payer: Self-pay | Admitting: *Deleted

## 2019-09-20 NOTE — Telephone Encounter (Signed)
Pt tested at [facility] on 12/15 and got a positive result today. Pt has not been at [facility] in over 4 yrs, so not a pt anymore.  Pt works for [employer], and needs a letter stating CDC guidelines and what is recommended for her to quarantine.     Pt requesting a letter stating that she is positive for her job and how long she would need to be in quarantine. Her test results are not in Epic. She was tested at [facility].  Advised her to check with [facility] to see what lab they used and to try to get a copy of her results from them. She voiced understanding.

## 2019-09-20 NOTE — Telephone Encounter (Signed)
error 

## 2020-03-27 ENCOUNTER — Encounter: Payer: Self-pay | Admitting: Physician Assistant

## 2020-03-27 ENCOUNTER — Ambulatory Visit: Payer: Managed Care, Other (non HMO) | Admitting: Physician Assistant

## 2020-03-27 ENCOUNTER — Other Ambulatory Visit: Payer: Self-pay

## 2020-03-27 VITALS — BP 128/74 | HR 91 | Temp 99.6°F | Resp 16 | Ht 63.0 in | Wt 147.0 lb

## 2020-03-27 DIAGNOSIS — S01311A Laceration without foreign body of right ear, initial encounter: Secondary | ICD-10-CM

## 2020-03-27 MED ORDER — MUPIROCIN 2 % EX OINT: 1.0000 "application " | TOPICAL_OINTMENT | Freq: Three times a day (TID) | CUTANEOUS | 0 refills | Status: DC

## 2020-03-27 NOTE — Progress Notes (Signed)
   Subjective:    Patient ID: Jill Ramos, female    DOB: 10-02-1990, 29 y.o.   MRN: [id number]  HPI    Review of Systems     Objective:   Physical Exam        Assessment & Plan:

## 2020-03-27 NOTE — Progress Notes (Signed)
   Subjective:    Patient ID: Jill Ramos, female    DOB: 21-Aug-1991, 29 y.o.   MRN: [id number]  HPI 29 yo F presents concerned about bruised earlobe noticed in mirror this morning.. Wondering if it is a spider bite ? No awareness of insect.  On questioning  wore new large hoop earrings over the weekend with a jeans jacket w collar. Not aware of an injury but did flip collar multiple times. Was not dancing. Uses cell phone to ear. Denies pain   Review of Systems As above    Objective:   Physical Exam Vitals and nursing note reviewed.  Constitutional:      Appearance: Normal appearance.  HENT:     Head: Normocephalic.     Comments: Right earlobe with dependant bruising  Pierced earring hole has been slightly elongated with new laceration evident  approx 2 mm inferiorly      Right Ear: Tympanic membrane and ear canal normal.     Left Ear: Tympanic membrane, ear canal and external ear normal.     Nose: Nose normal.  Eyes:     Extraocular Movements: Extraocular movements intact.  Neurological:     Mental Status: She is alert.       Assessment & Plan:  Laceration, minor, right earlobe pierced earring tract extended inferiorly Dependant bruising earlobe  Plan Mupirocin ointment massaged into wound. Also surgical steel or 10/14 K gold post coated with mupirocin placed in piercing canal for a week- rotate daily - may remove and replace immediately daily after 3 days  Caution wire posts in future

## 2020-06-08 ENCOUNTER — Ambulatory Visit: Payer: 59 | Admitting: Obstetrics and Gynecology

## 2020-06-14 NOTE — Patient Instructions (Signed)
I value your feedback and entrusting us with your care. If you get a [facility] patient survey, I would appreciate you taking the time to let us know about your experience today. Thank you!  As of September 09, 2019, your lab results will be released to your MyChart immediately, before I even have a chance to see them. Please give me time to review them and contact you if there are any abnormalities. Thank you for your patience.  

## 2020-06-14 NOTE — Progress Notes (Signed)
PCP:  Patient, No Pcp Per   Chief Complaint  Patient presents with  . Gynecologic Exam     HPI:      Ms. Jill Ramos is a 29 y.o. G1P1001 whose LMP was Patient's last menstrual period was 06/09/2020 (exact date)., presents today for her annual examination.  Her menses are regular every 28-30 days, lasting 4 days.  Dysmenorrhea none. She does not have intermenstrual bleeding.  Sex activity: single partner, contraception - OCP (estrogen/progesterone).  Last Pap: 05/24/19  Results were: no abnormalities  Hx of STDs: chlamydia  There is no FH of breast cancer. There is no FH of ovarian cancer. The patient does do self-breast exams.  Tobacco use: The patient denies current or previous tobacco use. Alcohol use: none No drug use.  Exercise: moderately active  She does get adequate calcium and Vitamin D in her diet. Did 1 of Gardasil but never did other 2. Declines for now.   Upstream - 06/15/20 1012      Pregnancy Intention Screening   Does the patient want to become pregnant in the next year? No    Does the patient's partner want to become pregnant in the next year? No    Would the patient like to discuss contraceptive options today? Yes      Contraception Wrap Up   Current Method Oral Contraceptive          The pregnancy intention screening data noted above was reviewed. Potential methods of contraception were discussed. The patient elected to proceed with Oral Contraceptive.    Past Medical History:  Diagnosis Date  . Preeclampsia   . STD (sexually transmitted disease) 06/2014   chlamydia    Past Surgical History:  Procedure Laterality Date  . NO PAST SURGERIES      Family History  Problem Relation Age of Onset  . Diabetes Maternal Grandmother   . Hypertension Maternal Grandmother   . Diabetes Paternal Grandmother   . Heart disease Paternal Grandfather   . Stomach cancer Cousin     Social History   Socioeconomic History  . Marital status: Single     Spouse name: Not on file  . Number of children: 1  . Years of education: 34  . Highest education level: Not on file  Occupational History  . Occupation: case worker    Comment: [employer]  Tobacco Use  . Smoking status: Never Smoker  . Smokeless tobacco: Never Used  Vaping Use  . Vaping Use: Never used  Substance and Sexual Activity  . Alcohol use: No    Alcohol/week: 0.0 standard drinks  . Drug use: No  . Sexual activity: Yes    Partners: Male    Birth control/protection: Pill  Other Topics Concern  . Not on file  Social History Narrative  . Not on file   Social Determinants of Health   Financial Resource Strain:   . Difficulty of Paying Living Expenses: Not on file  Food Insecurity:   . Worried About Programme researcher, broadcasting/film/video in the Last Year: Not on file  . Ran Out of Food in the Last Year: Not on file  Transportation Needs:   . Lack of Transportation (Medical): Not on file  . Lack of Transportation (Non-Medical): Not on file  Physical Activity:   . Days of Exercise per Week: Not on file  . Minutes of Exercise per Session: Not on file  Stress:   . Feeling of Stress : Not on file  Social Connections:   .  Frequency of Communication with Friends and Family: Not on file  . Frequency of Social Gatherings with Friends and Family: Not on file  . Attends Religious Services: Not on file  . Active Member of Clubs or Organizations: Not on file  . Attends Banker Meetings: Not on file  . Marital Status: Not on file  Intimate Partner Violence:   . Fear of Current or Ex-Partner: Not on file  . Emotionally Abused: Not on file  . Physically Abused: Not on file  . Sexually Abused: Not on file     Current Outpatient Medications:  .  levonorgestrel-ethinyl estradiol (LILLOW) 0.15-30 MG-MCG tablet, Take 1 tablet by mouth daily., Disp: 84 tablet, Rfl: 3     ROS:  Review of Systems  Constitutional: Negative for fatigue, fever and unexpected weight change.    Respiratory: Negative for cough, shortness of breath and wheezing.   Cardiovascular: Negative for chest pain, palpitations and leg swelling.  Gastrointestinal: Negative for blood in stool, constipation, diarrhea, nausea and vomiting.  Endocrine: Negative for cold intolerance, heat intolerance and polyuria.  Genitourinary: Negative for dyspareunia, dysuria, flank pain, frequency, genital sores, hematuria, menstrual problem, pelvic pain, urgency, vaginal bleeding, vaginal discharge and vaginal pain.  Musculoskeletal: Negative for back pain, joint swelling and myalgias.  Skin: Negative for rash.  Neurological: Negative for dizziness, syncope, light-headedness, numbness and headaches.  Hematological: Negative for adenopathy.  Psychiatric/Behavioral: Negative for agitation, confusion, sleep disturbance and suicidal ideas. The patient is not nervous/anxious.   BREAST: No symptoms   Objective: BP 120/90   Ht 5\' 3"  (1.6 m)   Wt 147 lb (66.7 kg)   LMP 06/09/2020 (Exact Date)   BMI 26.04 kg/m    Physical Exam Constitutional:      Appearance: She is well-developed.  Genitourinary:     Vulva, vagina, cervix, uterus, right adnexa and left adnexa normal.     No vulval lesion or tenderness noted.     No vaginal discharge, erythema or tenderness.     No cervical polyp.     Uterus is not enlarged or tender.     No right or left adnexal mass present.     Right adnexa not tender.     Left adnexa not tender.  Neck:     Thyroid: No thyromegaly.  Cardiovascular:     Rate and Rhythm: Normal rate and regular rhythm.     Heart sounds: Normal heart sounds. No murmur heard.   Pulmonary:     Effort: Pulmonary effort is normal.     Breath sounds: Normal breath sounds.  Chest:     Breasts:        Right: No mass, nipple discharge, skin change or tenderness.        Left: No mass, nipple discharge, skin change or tenderness.  Abdominal:     Palpations: Abdomen is soft.     Tenderness: There is no  abdominal tenderness. There is no guarding.  Musculoskeletal:        General: Normal range of motion.     Cervical back: Normal range of motion.  Neurological:     General: No focal deficit present.     Mental Status: She is alert and oriented to person, place, and time.     Cranial Nerves: No cranial nerve deficit.  Skin:    General: Skin is warm and dry.  Psychiatric:        Mood and Affect: Mood normal.        Behavior:  Behavior normal.        Thought Content: Thought content normal.        Judgment: Judgment normal.  Vitals reviewed.     Assessment/Plan: Encounter for annual routine gynecological examination  Screening for STD (sexually transmitted disease) - Plan: Cervicovaginal ancillary only  Encounter for surveillance of contraceptive pills - Plan: levonorgestrel-ethinyl estradiol (LILLOW) 0.15-30 MG-MCG tablet  Meds ordered this encounter  Medications  . levonorgestrel-ethinyl estradiol (LILLOW) 0.15-30 MG-MCG tablet    Sig: Take 1 tablet by mouth daily.    Dispense:  84 tablet    Refill:  3    Order Specific Question:   Supervising Provider    Answer:   Nadara Mustard [[id number]]             GYN counsel adequate intake of calcium and vitamin D, diet and exercise, Gardasil     F/U  Return in about 1 year (around 06/15/2021).  Sheza Strickland B. Rubee Vega, PA-C 06/15/2020 10:40 AM

## 2020-06-15 ENCOUNTER — Other Ambulatory Visit (HOSPITAL_COMMUNITY)
Admission: RE | Admit: 2020-06-15 | Discharge: 2020-06-15 | Disposition: A | Discharge status: Home / Self Care | Payer: Managed Care, Other (non HMO) | Source: Ambulatory Visit | Attending: Obstetrics and Gynecology | Admitting: Obstetrics and Gynecology

## 2020-06-15 ENCOUNTER — Ambulatory Visit (INDEPENDENT_AMBULATORY_CARE_PROVIDER_SITE_OTHER): Payer: Managed Care, Other (non HMO) | Admitting: Obstetrics and Gynecology

## 2020-06-15 ENCOUNTER — Other Ambulatory Visit: Payer: Self-pay

## 2020-06-15 ENCOUNTER — Encounter: Payer: Self-pay | Admitting: Obstetrics and Gynecology

## 2020-06-15 VITALS — BP 120/90 | Ht 63.0 in | Wt 147.0 lb

## 2020-06-15 DIAGNOSIS — Z113 Encounter for screening for infections with a predominantly sexual mode of transmission: Secondary | ICD-10-CM | POA: Insufficient documentation

## 2020-06-15 DIAGNOSIS — Z3041 Encounter for surveillance of contraceptive pills: Secondary | ICD-10-CM | POA: Diagnosis not present

## 2020-06-15 DIAGNOSIS — Z01419 Encounter for gynecological examination (general) (routine) without abnormal findings: Secondary | ICD-10-CM | POA: Diagnosis not present

## 2020-06-15 MED ORDER — LEVONORGESTREL-ETHINYL ESTRAD 0.15-30 MG-MCG PO TABS: 1.0000 | ORAL_TABLET | Freq: Every day | ORAL | 3 refills | Status: DC

## 2020-06-16 LAB — CERVICOVAGINAL ANCILLARY ONLY
Chlamydia: NEGATIVE
Comment: NEGATIVE
Comment: NORMAL
Neisseria Gonorrhea: NEGATIVE

## 2021-05-25 ENCOUNTER — Telehealth: Payer: Self-pay | Admitting: Obstetrics and Gynecology

## 2021-05-25 DIAGNOSIS — Z3041 Encounter for surveillance of contraceptive pills: Secondary | ICD-10-CM

## 2021-05-25 NOTE — Telephone Encounter (Signed)
Attempted to reach via phone. Voicemail is full unable to leave message

## 2021-05-25 NOTE — Telephone Encounter (Signed)
Patient is scheduled for 06/25/21 with ABC

## 2021-06-21 NOTE — Progress Notes (Signed)
PCP:  Patient, No Pcp Per (Inactive)   Chief Complaint  Patient presents with   Gynecologic Exam    No concerns     HPI:      Ms. Jill Ramos is a 30 y.o. G1P1001 whose LMP was Patient's last menstrual period was 05/31/2021 (approximate)., presents today for her annual examination.  Her menses are regular every 28-30 days, lasting 4 days on OCPs.  Dysmenorrhea none. She does not have intermenstrual bleeding.  Sex activity: single partner, contraception - OCP (estrogen/progesterone).  Last Pap: 05/24/19  Results were: no abnormalities  Hx of STDs: chlamydia  There is no FH of breast cancer. There is no FH of ovarian cancer. The patient does do self-breast exams.  Tobacco use: The patient denies current or previous tobacco use. Alcohol use: none No drug use.  Exercise: moderately active  She does get adequate calcium and Vitamin D in her diet. Did 1 of Gardasil but never did other 2. Declines for now. Needs TdAP  Past Medical History:  Diagnosis Date   Preeclampsia    STD (sexually transmitted disease) 06/2014   chlamydia    Past Surgical History:  Procedure Laterality Date   NO PAST SURGERIES      Family History  Problem Relation Age of Onset   Diabetes Maternal Grandmother    Hypertension Maternal Grandmother    Diabetes Paternal Grandmother    Heart disease Paternal Grandfather    Stomach cancer Cousin     Social History   Socioeconomic History   Marital status: Single    Spouse name: Not on file   Number of children: 1   Years of education: 16   Highest education level: Not on file  Occupational History   Occupation: case Financial controller    Comment: [employer]  Tobacco Use   Smoking status: Never   Smokeless tobacco: Never  Vaping Use   Vaping Use: Never used  Substance and Sexual Activity   Alcohol use: No    Alcohol/week: 0.0 standard drinks   Drug use: No   Sexual activity: Yes    Partners: Male    Birth control/protection: Pill  Other Topics  Concern   Not on file  Social History Narrative   Not on file   Social Determinants of Health   Financial Resource Strain: Not on file  Food Insecurity: Not on file  Transportation Needs: Not on file  Physical Activity: Not on file  Stress: Not on file  Social Connections: Not on file  Intimate Partner Violence: Not on file     Current Outpatient Medications:    levonorgestrel-ethinyl estradiol (ALTAVERA) 0.15-30 MG-MCG tablet, Take 1 tablet by mouth daily., Disp: 84 tablet, Rfl: 3     ROS:  Review of Systems  Constitutional:  Negative for fatigue, fever and unexpected weight change.  Respiratory:  Negative for cough, shortness of breath and wheezing.   Cardiovascular:  Negative for chest pain, palpitations and leg swelling.  Gastrointestinal:  Negative for blood in stool, constipation, diarrhea, nausea and vomiting.  Endocrine: Negative for cold intolerance, heat intolerance and polyuria.  Genitourinary:  Negative for dyspareunia, dysuria, flank pain, frequency, genital sores, hematuria, menstrual problem, pelvic pain, urgency, vaginal bleeding, vaginal discharge and vaginal pain.  Musculoskeletal:  Negative for back pain, joint swelling and myalgias.  Skin:  Negative for rash.  Neurological:  Negative for dizziness, syncope, light-headedness, numbness and headaches.  Hematological:  Negative for adenopathy.  Psychiatric/Behavioral:  Negative for agitation, confusion, sleep disturbance and suicidal  ideas. The patient is not nervous/anxious.  BREAST: No symptoms   Objective: BP 130/84   Ht 5\' 3"  (1.6 m)   Wt 156 lb (70.8 kg)   LMP 05/31/2021 (Approximate)   BMI 27.63 kg/m    Physical Exam Constitutional:      Appearance: She is well-developed.  Genitourinary:     Vulva normal.     Right Labia: No rash, tenderness or lesions.    Left Labia: No tenderness, lesions or rash.    No vaginal discharge, erythema or tenderness.      Right Adnexa: not tender and no mass  present.    Left Adnexa: not tender and no mass present.    No cervical friability or polyp.     Uterus is not enlarged or tender.  Breasts:    Right: No mass, nipple discharge, skin change or tenderness.     Left: No mass, nipple discharge, skin change or tenderness.  Neck:     Thyroid: No thyromegaly.  Cardiovascular:     Rate and Rhythm: Normal rate and regular rhythm.     Heart sounds: Normal heart sounds. No murmur heard. Pulmonary:     Effort: Pulmonary effort is normal.     Breath sounds: Normal breath sounds.  Abdominal:     Palpations: Abdomen is soft.     Tenderness: There is no abdominal tenderness. There is no guarding or rebound.  Musculoskeletal:        General: Normal range of motion.     Cervical back: Normal range of motion.  Lymphadenopathy:     Cervical: No cervical adenopathy.  Neurological:     General: No focal deficit present.     Mental Status: She is alert and oriented to person, place, and time.     Cranial Nerves: No cranial nerve deficit.  Skin:    General: Skin is warm and dry.  Psychiatric:        Mood and Affect: Mood normal.        Behavior: Behavior normal.        Thought Content: Thought content normal.        Judgment: Judgment normal.  Vitals reviewed.    Assessment/Plan: Encounter for annual routine gynecological examination  Encounter for surveillance of contraceptive pills - Plan: levonorgestrel-ethinyl estradiol (ALTAVERA) 0.15-30 MG-MCG tablet; OCP RF  Meds ordered this encounter  Medications   levonorgestrel-ethinyl estradiol (ALTAVERA) 0.15-30 MG-MCG tablet    Sig: Take 1 tablet by mouth daily.    Dispense:  84 tablet    Refill:  3    Order Specific Question:   Supervising Provider    Answer:   02[birth year]2004 Nadara Mustard             GYN counsel adequate intake of calcium and vitamin D, diet and exercise, Gardasil     F/U  Return in about 1 year (around 06/25/2022).  Leba Tibbitts B. Jenasia Dolinar, PA-C 06/25/2021 11:16 AM

## 2021-06-25 ENCOUNTER — Encounter: Payer: Self-pay | Admitting: Obstetrics and Gynecology

## 2021-06-25 ENCOUNTER — Ambulatory Visit (INDEPENDENT_AMBULATORY_CARE_PROVIDER_SITE_OTHER): Payer: Managed Care, Other (non HMO) | Admitting: Obstetrics and Gynecology

## 2021-06-25 ENCOUNTER — Other Ambulatory Visit: Payer: Self-pay

## 2021-06-25 VITALS — BP 130/84 | Ht 63.0 in | Wt 156.0 lb

## 2021-06-25 DIAGNOSIS — Z3041 Encounter for surveillance of contraceptive pills: Secondary | ICD-10-CM

## 2021-06-25 DIAGNOSIS — Z01419 Encounter for gynecological examination (general) (routine) without abnormal findings: Secondary | ICD-10-CM | POA: Diagnosis not present

## 2021-06-25 DIAGNOSIS — Z124 Encounter for screening for malignant neoplasm of cervix: Secondary | ICD-10-CM

## 2021-06-25 DIAGNOSIS — Z23 Encounter for immunization: Secondary | ICD-10-CM | POA: Diagnosis not present

## 2021-06-25 MED ORDER — LEVONORGESTREL-ETHINYL ESTRAD 0.15-30 MG-MCG PO TABS: 1.0000 | ORAL_TABLET | Freq: Every day | ORAL | 3 refills | Status: DC

## 2021-06-25 NOTE — Patient Instructions (Signed)
I value your feedback and you entrusting us with your care. If you get a [facility] patient survey, I would appreciate you taking the time to let us know about your experience today. Thank you! ? ? ?

## 2021-06-25 NOTE — Addendum Note (Signed)
Addended by: Donnetta Hail on: 06/25/2021 11:27 AM   Modules accepted: Orders

## 2022-05-15 ENCOUNTER — Other Ambulatory Visit: Payer: Self-pay

## 2022-05-15 DIAGNOSIS — Z3041 Encounter for surveillance of contraceptive pills: Secondary | ICD-10-CM

## 2022-05-15 MED ORDER — LEVONORGESTREL-ETHINYL ESTRAD 0.15-30 MG-MCG PO TABS: 1.0000 | ORAL_TABLET | Freq: Every day | ORAL | 0 refills | Status: DC

## 2022-05-15 NOTE — Telephone Encounter (Signed)
Pt calling; has scheduled an appt for 10/3rd; needs refill to get her to appt.  [phone]  Mailbox is full; will send MyChart msg.

## 2022-07-01 NOTE — Progress Notes (Signed)
PCP:  Patient, No Pcp Per   Chief Complaint  Patient presents with   Gynecologic Exam    Monthly cycles 4 days light/moderate flow no pain, no concerns     HPI:      Jill Ramos is a 31 y.o. G1P1001 whose LMP was Patient's last menstrual period was 06/27/2022 (approximate)., presents today for her annual examination.  Her menses are regular every 28-30 days, lasting 4 days on OCPs.  Dysmenorrhea none. She does not have intermenstrual bleeding.  Sex activity: single partner, contraception - OCP (estrogen/progesterone). No pain/bleeding.  Last Pap: 05/24/19  Results were: no abnormalities  Hx of STDs: chlamydia  There is no FH of breast cancer. There is no FH of ovarian cancer. The patient does do self-breast exams.  Tobacco use: The patient denies current or previous tobacco use. Alcohol use: none No drug use.  Exercise: moderately active  She does get adequate calcium and Vitamin D in her diet. Did 1 of Gardasil but never did other 2. Declines for now.  Needs fasting labs for work health form. Not fasting today   Past Medical History:  Diagnosis Date   Preeclampsia    STD (sexually transmitted disease) 06/2014   chlamydia    Past Surgical History:  Procedure Laterality Date   NO PAST SURGERIES      Family History  Problem Relation Age of Onset   Diabetes Maternal Grandmother    Hypertension Maternal Grandmother    Diabetes Paternal Grandmother    Heart disease Paternal Grandfather    Stomach cancer Cousin     Social History   Socioeconomic History   Marital status: Single    Spouse name: Not on file   Number of children: 1   Years of education: 16   Highest education level: Not on file  Occupational History   Occupation: case Financial controller    Comment: [employer]  Tobacco Use   Smoking status: Never   Smokeless tobacco: Never  Vaping Use   Vaping Use: Never used  Substance and Sexual Activity   Alcohol use: No    Alcohol/week: 0.0 standard drinks of  alcohol   Drug use: No   Sexual activity: Yes    Partners: Male    Birth control/protection: Pill, Condom  Other Topics Concern   Not on file  Social History Narrative   Not on file   Social Determinants of Health   Financial Resource Strain: Not on file  Food Insecurity: Not on file  Transportation Needs: Not on file  Physical Activity: Not on file  Stress: Not on file  Social Connections: Not on file  Intimate Partner Violence: Not on file     Current Outpatient Medications:    levonorgestrel-ethinyl estradiol (ALTAVERA) 0.15-30 MG-MCG tablet, Take 1 tablet by mouth daily., Disp: 84 tablet, Rfl: 3     ROS:  Review of Systems  Constitutional:  Negative for fatigue, fever and unexpected weight change.  Respiratory:  Negative for cough, shortness of breath and wheezing.   Cardiovascular:  Negative for chest pain, palpitations and leg swelling.  Gastrointestinal:  Negative for blood in stool, constipation, diarrhea, nausea and vomiting.  Endocrine: Negative for cold intolerance, heat intolerance and polyuria.  Genitourinary:  Negative for dyspareunia, dysuria, flank pain, frequency, genital sores, hematuria, menstrual problem, pelvic pain, urgency, vaginal bleeding, vaginal discharge and vaginal pain.  Musculoskeletal:  Negative for back pain, joint swelling and myalgias.  Skin:  Negative for rash.  Neurological:  Negative for dizziness,  syncope, light-headedness, numbness and headaches.  Hematological:  Negative for adenopathy.  Psychiatric/Behavioral:  Negative for agitation, confusion, sleep disturbance and suicidal ideas. The patient is not nervous/anxious.   BREAST: No symptoms   Objective: BP 110/80   Ht 5\' 3"  (1.6 m)   Wt 163 lb (73.9 kg)   LMP 06/27/2022 (Approximate)   BMI 28.87 kg/m    Physical Exam Constitutional:      Appearance: She is well-developed.  Genitourinary:     Vulva normal.     Right Labia: No rash, tenderness or lesions.    Left  Labia: No tenderness, lesions or rash.    No vaginal discharge, erythema or tenderness.      Right Adnexa: not tender and no mass present.    Left Adnexa: not tender and no mass present.    No cervical friability or polyp.     Uterus is not enlarged or tender.  Breasts:    Right: No mass, nipple discharge, skin change or tenderness.     Left: No mass, nipple discharge, skin change or tenderness.  Neck:     Thyroid: No thyromegaly.  Cardiovascular:     Rate and Rhythm: Normal rate and regular rhythm.     Heart sounds: Normal heart sounds. No murmur heard. Pulmonary:     Effort: Pulmonary effort is normal.     Breath sounds: Normal breath sounds.  Abdominal:     Palpations: Abdomen is soft.     Tenderness: There is no abdominal tenderness. There is no guarding or rebound.  Musculoskeletal:        General: Normal range of motion.     Cervical back: Normal range of motion.  Lymphadenopathy:     Cervical: No cervical adenopathy.  Neurological:     General: No focal deficit present.     Mental Status: She is alert and oriented to person, place, and time.     Cranial Nerves: No cranial nerve deficit.  Skin:    General: Skin is warm and dry.  Psychiatric:        Mood and Affect: Mood normal.        Behavior: Behavior normal.        Thought Content: Thought content normal.        Judgment: Judgment normal.  Vitals reviewed.     Assessment/Plan: Encounter for annual routine gynecological examination  Cervical cancer screening - Plan: Cytology - PAP  Encounter for surveillance of contraceptive pills - Plan: levonorgestrel-ethinyl estradiol (ALTAVERA) 0.15-30 MG-MCG tablet; OCP RF  Blood tests for routine general physical examination - Plan: Comprehensive metabolic panel, Lipid panel  Screening cholesterol level - Plan: Lipid panel  Will complete health form once lab results back and fax to [facility].   Meds ordered this encounter  Medications   levonorgestrel-ethinyl  estradiol (ALTAVERA) 0.15-30 MG-MCG tablet    Sig: Take 1 tablet by mouth daily.    Dispense:  84 tablet    Refill:  3    Order Specific Question:   Supervising Provider    Answer:   Renaldo Reel            GYN counsel adequate intake of calcium and vitamin D, diet and exercise     F/U  Return in about 1 day (around 07/03/2022) for fasting lab appt tomorrow.  Jill Paone B. Maryann Mccall, PA-C 07/02/2022 2:02 PM

## 2022-07-02 ENCOUNTER — Other Ambulatory Visit (HOSPITAL_COMMUNITY)
Admission: RE | Admit: 2022-07-02 | Discharge: 2022-07-02 | Disposition: A | Discharge status: Home / Self Care | Payer: Managed Care, Other (non HMO) | Source: Ambulatory Visit | Attending: Obstetrics and Gynecology | Admitting: Obstetrics and Gynecology

## 2022-07-02 ENCOUNTER — Encounter: Payer: Self-pay | Admitting: Obstetrics and Gynecology

## 2022-07-02 ENCOUNTER — Ambulatory Visit: Payer: Managed Care, Other (non HMO) | Admitting: Obstetrics and Gynecology

## 2022-07-02 VITALS — BP 110/80 | Ht 63.0 in | Wt 163.0 lb

## 2022-07-02 DIAGNOSIS — Z01419 Encounter for gynecological examination (general) (routine) without abnormal findings: Secondary | ICD-10-CM

## 2022-07-02 DIAGNOSIS — Z13220 Encounter for screening for lipoid disorders: Secondary | ICD-10-CM

## 2022-07-02 DIAGNOSIS — Z124 Encounter for screening for malignant neoplasm of cervix: Secondary | ICD-10-CM | POA: Insufficient documentation

## 2022-07-02 DIAGNOSIS — Z3041 Encounter for surveillance of contraceptive pills: Secondary | ICD-10-CM

## 2022-07-02 DIAGNOSIS — Z0000 Encounter for general adult medical examination without abnormal findings: Secondary | ICD-10-CM

## 2022-07-02 MED ORDER — LEVONORGESTREL-ETHINYL ESTRAD 0.15-30 MG-MCG PO TABS: 1.0000 | ORAL_TABLET | Freq: Every day | ORAL | 3 refills | Status: DC

## 2022-07-02 NOTE — Patient Instructions (Signed)
I value your feedback and you entrusting us with your care. If you get a [facility] patient survey, I would appreciate you taking the time to let us know about your experience today. Thank you! ? ? ?

## 2022-07-03 ENCOUNTER — Other Ambulatory Visit: Payer: Managed Care, Other (non HMO)

## 2022-07-03 DIAGNOSIS — Z13220 Encounter for screening for lipoid disorders: Secondary | ICD-10-CM

## 2022-07-03 DIAGNOSIS — Z0000 Encounter for general adult medical examination without abnormal findings: Secondary | ICD-10-CM

## 2022-07-04 LAB — COMPREHENSIVE METABOLIC PANEL
ALT: 13 IU/L (ref 0–32)
AST: 15 IU/L (ref 0–40)
Albumin/Globulin Ratio: 1.3 (ref 1.2–2.2)
Albumin: 4.4 g/dL (ref 4.0–5.0)
Alkaline Phosphatase: 65 IU/L (ref 44–121)
BUN/Creatinine Ratio: 11 (ref 9–23)
BUN: 8 mg/dL (ref 6–20)
Bilirubin Total: 0.2 mg/dL (ref 0.0–1.2)
CO2: 20 mmol/L (ref 20–29)
Calcium: 9.3 mg/dL (ref 8.7–10.2)
Chloride: 104 mmol/L (ref 96–106)
Creatinine, Ser: 0.74 mg/dL (ref 0.57–1.00)
Globulin, Total: 3.3 g/dL (ref 1.5–4.5)
Glucose: 83 mg/dL (ref 70–99)
Potassium: 4.1 mmol/L (ref 3.5–5.2)
Sodium: 139 mmol/L (ref 134–144)
Total Protein: 7.7 g/dL (ref 6.0–8.5)
eGFR: 112 mL/min/{1.73_m2} (ref >59)

## 2022-07-04 LAB — CYTOLOGY - PAP: Diagnosis: NEGATIVE

## 2022-07-04 LAB — LIPID PANEL
Chol/HDL Ratio: 5 ratio — ABNORMAL HIGH (ref 0.0–4.4)
Cholesterol, Total: 209 mg/dL — ABNORMAL HIGH (ref 100–199)
HDL: 42 mg/dL (ref >39)
LDL Chol Calc (NIH): 154 mg/dL — ABNORMAL HIGH (ref 0–99)
Triglycerides: 72 mg/dL (ref 0–149)
VLDL Cholesterol Cal: 13 mg/dL (ref 5–40)

## 2022-07-06 NOTE — Progress Notes (Signed)
Pls complete health form with these results and notify pt. Thx.

## 2022-11-10 ENCOUNTER — Ambulatory Visit
Admission: EM | Admit: 2022-11-10 | Discharge: 2022-11-10 | Disposition: A | Discharge status: Home / Self Care | Payer: Managed Care, Other (non HMO) | Attending: Emergency Medicine | Admitting: Emergency Medicine

## 2022-11-10 DIAGNOSIS — R61 Generalized hyperhidrosis: Secondary | ICD-10-CM | POA: Diagnosis not present

## 2022-11-10 DIAGNOSIS — Z1152 Encounter for screening for COVID-19: Secondary | ICD-10-CM | POA: Diagnosis not present

## 2022-11-10 DIAGNOSIS — R058 Other specified cough: Secondary | ICD-10-CM | POA: Insufficient documentation

## 2022-11-10 DIAGNOSIS — J101 Influenza due to other identified influenza virus with other respiratory manifestations: Secondary | ICD-10-CM

## 2022-11-10 LAB — RESP PANEL BY RT-PCR (RSV, FLU A&B, COVID)  RVPGX2
Influenza A by PCR: NEGATIVE
Influenza B by PCR: POSITIVE — AB
Resp Syncytial Virus by PCR: NEGATIVE
SARS Coronavirus 2 by RT PCR: NEGATIVE

## 2022-11-10 MED ORDER — IPRATROPIUM BROMIDE 0.06 % NA SOLN: 2.0000 | Freq: Four times a day (QID) | NASAL | 12 refills | Status: DC

## 2022-11-10 MED ORDER — PROMETHAZINE-DM 6.25-15 MG/5ML PO SYRP: 5.0000 mL | ORAL_SOLUTION | Freq: Four times a day (QID) | ORAL | 0 refills | Status: DC | PRN

## 2022-11-10 MED ORDER — BENZONATATE 100 MG PO CAPS: 200.0000 mg | ORAL_CAPSULE | Freq: Three times a day (TID) | ORAL | 0 refills | Status: DC

## 2022-11-10 MED ORDER — OSELTAMIVIR PHOSPHATE 75 MG PO CAPS: 75.0000 mg | ORAL_CAPSULE | Freq: Two times a day (BID) | ORAL | 0 refills | Status: DC

## 2022-11-10 NOTE — Discharge Instructions (Signed)
You have tested positive for influenza B today.  Please take the Tamiflu 75 mg twice daily for 5 days for treatment of influenza.  Take over-the-counter Tylenol and/or ibuprofen according to the package instructions as needed for fever and bodyaches.  Use the Atrovent nasal spray, 2 squirts up each nostril every 6 hours, as needed for runny nose and nasal congestion.  Use the Tessalon Perles every 8 hours during the day as needed for cough.  Take them with a small sip of water.  They may give you numbness to the base of your tongue or metallic taste in mouth, this is normal.  Use the Promethazine DM cough syrup at bedtime as needed for cough congestion.  This medication will make you drowsy.  If you develop any new or worsening symptoms please return for reevaluation or see your primary care provider.

## 2022-11-10 NOTE — ED Triage Notes (Signed)
Pt reports   Son tested positive for covid and flu B on Tuesday. I am not experiencing sweats, body aches, cough and sneezing.

## 2022-11-10 NOTE — ED Triage Notes (Signed)
No fever, Took tylenol dayquil and mucinex

## 2022-11-10 NOTE — ED Provider Notes (Signed)
[facility]    CSN: [id number] Arrival date & time: 11/10/22  1348      History   Chief Complaint Chief Complaint  Patient presents with   Cough   Generalized Body Aches    HPI Jill Ramos is a 32 y.o. female.   HPI  32 year old female here for evaluation respiratory complaints.  The patient reports that her son tested positive for influenza B and COVID this past Tuesday.  2 days ago she started to experience runny nose, nasal congestion, sneezing, body aches, sweats, and a cough that was initially nonproductive but is now starting to be more productive.  She also reports a mildly sore throat.  She denies any fever, shortness of breath, wheezing, or GI complaints.  Past Medical History:  Diagnosis Date   Preeclampsia    STD (sexually transmitted disease) 06/2014   chlamydia    Patient Active Problem List   Diagnosis Date Noted   History of pre-eclampsia 03/17/2017   Contraceptive management 03/17/2017    Past Surgical History:  Procedure Laterality Date   NO PAST SURGERIES      OB History     Gravida  1   Para  1   Term  1   Preterm      AB      Living  1      SAB      IAB      Ectopic      Multiple      Live Births  1        Obstetric Comments  IOL for preeclampsia/ insufficient prenatal care          Home Medications    Prior to Admission medications   Medication Sig Start Date End Date Taking? Authorizing Provider  benzonatate (TESSALON) 100 MG capsule Take 2 capsules (200 mg total) by mouth every 8 (eight) hours. 11/10/22  Yes Margarette Canada, NP  ipratropium (ATROVENT) 0.06 % nasal spray Place 2 sprays into both nostrils 4 (four) times daily. 11/10/22  Yes Margarette Canada, NP  levonorgestrel-ethinyl estradiol (ALTAVERA) 0.15-30 MG-MCG tablet Take 1 tablet by mouth daily. [id number]  Yes Copland, Deirdre Evener, PA-C  oseltamivir (TAMIFLU) 75 MG capsule Take 1 capsule (75 mg total) by mouth every 12 (twelve) hours. 11/10/22   Yes Margarette Canada, NP  promethazine-dextromethorphan (PROMETHAZINE-DM) 6.25-15 MG/5ML syrup Take 5 mLs by mouth 4 (four) times daily as needed. 11/10/22  Yes Margarette Canada, NP    Family History Family History  Problem Relation Age of Onset   Diabetes Maternal Grandmother    Hypertension Maternal Grandmother    Diabetes Paternal Grandmother    Heart disease Paternal Grandfather    Stomach cancer Cousin     Social History Social History   Tobacco Use   Smoking status: Never   Smokeless tobacco: Never  Vaping Use   Vaping Use: Never used  Substance Use Topics   Alcohol use: No    Alcohol/week: 0.0 standard drinks of alcohol   Drug use: No     Allergies   Patient has no known allergies.   Review of Systems Review of Systems  Constitutional:  Positive for diaphoresis and fever.  HENT:  Positive for congestion, rhinorrhea and sore throat. Negative for ear pain.   Respiratory:  Positive for cough. Negative for shortness of breath and wheezing.   Gastrointestinal:  Negative for diarrhea, nausea and vomiting.  Musculoskeletal:  Positive for arthralgias and myalgias.  Skin:  Negative for rash.  Hematological: Negative.   Psychiatric/Behavioral: Negative.       Physical Exam Triage Vital Signs ED Triage Vitals [11/10/22 1451]  Enc Vitals Group     BP      Pulse      Resp      Temp      Temp src      SpO2      Weight 152 lb (68.9 kg)     Height      Head Circumference      Peak Flow      Pain Score 6     Pain Loc      Pain Edu?      Excl. in [place]?    No data found.  Updated Vital Signs BP 122/83 (BP Location: Left Arm)   Pulse 97   Temp 98.9 F (37.2 C) (Oral)   Resp 16   Wt 152 lb (68.9 kg)   LMP 11/07/2022   SpO2 100%   BMI 26.93 kg/m   Visual Acuity Right Eye Distance:   Left Eye Distance:   Bilateral Distance:    Right Eye Near:   Left Eye Near:    Bilateral Near:     Physical Exam Vitals and nursing note reviewed.  Constitutional:       Appearance: Normal appearance. She is not ill-appearing.  HENT:     Head: Normocephalic and atraumatic.     Right Ear: Tympanic membrane, ear canal and external ear normal. There is no impacted cerumen.     Left Ear: Tympanic membrane, ear canal and external ear normal. There is no impacted cerumen.     Nose: Congestion and rhinorrhea present.     Comments: Nasal mucosa is erythematous and edematous with clear discharge in both nares.    Mouth/Throat:     Mouth: Mucous membranes are moist.     Pharynx: Oropharynx is clear. Posterior oropharyngeal erythema present. No oropharyngeal exudate.     Comments: Posterior oropharynx is erythematous injected with clear postnasal drip. Cardiovascular:     Rate and Rhythm: Normal rate and regular rhythm.     Pulses: Normal pulses.     Heart sounds: Normal heart sounds. No murmur heard.    No friction rub. No gallop.  Pulmonary:     Effort: Pulmonary effort is normal.     Breath sounds: Normal breath sounds. No wheezing, rhonchi or rales.  Musculoskeletal:     Cervical back: Normal range of motion and neck supple.  Lymphadenopathy:     Cervical: No cervical adenopathy.  Skin:    General: Skin is warm and dry.     Capillary Refill: Capillary refill takes less than 2 seconds.     Findings: No erythema or rash.  Neurological:     General: No focal deficit present.     Mental Status: She is alert and oriented to person, place, and time.  Psychiatric:        Mood and Affect: Mood normal.        Behavior: Behavior normal.        Thought Content: Thought content normal.        Judgment: Judgment normal.      UC Treatments / Results  Labs (all labs ordered are listed, but only abnormal results are displayed) Labs Reviewed  RESP PANEL BY RT-PCR (RSV, FLU A&B, COVID)  [id number] - Abnormal; Notable for the following components:      Result Value   Influenza B by PCR POSITIVE (*)  All other components within normal limits     EKG   Radiology No results found.  Procedures Procedures (including critical care time)  Medications Ordered in UC Medications - No data to display  Initial Impression / Assessment and Plan / UC Course  I have reviewed the triage vital signs and the nursing notes.  Pertinent labs & imaging results that were available during my care of the patient were reviewed by me and considered in my medical decision making (see chart for details).   Patient is a pleasant, nontoxic-appearing 32 year old female here for evaluation of 2 days worth of respiratory symptoms as outlined in HPI above.  She was exposed to both COVID and flu through her son who tested positive this past week.  She does have inflamed nasal mucosa with clear rhinorrhea and injection to her posterior oropharynx with clear postnasal drip.  The remainder of her upper respiratory tree is benign.  Cardiopulmonary exam reveals clear lung sounds in all fields.  I will order a COVID and flu PCR given her exposure and physical exam findings.  Respiratory panel is positive for influenza B.  I will discharge patient on the diagnosis of influenza B and started on Tamiflu 75 mg twice daily for 5 days for treatment of influenza.  I will also prescribe Atrovent nasal spray to help with the nasal congestion.  Tessalon Perles and Promethazine DM cough syrup to with cough and congestion.  Return precautions reviewed.   Final Clinical Impressions(s) / UC Diagnoses   Final diagnoses:  Influenza B     Discharge Instructions      You have tested positive for influenza B today.  Please take the Tamiflu 75 mg twice daily for 5 days for treatment of influenza.  Take over-the-counter Tylenol and/or ibuprofen according to the package instructions as needed for fever and bodyaches.  Use the Atrovent nasal spray, 2 squirts up each nostril every 6 hours, as needed for runny nose and nasal congestion.  Use the Tessalon Perles every 8 hours  during the day as needed for cough.  Take them with a small sip of water.  They may give you numbness to the base of your tongue or metallic taste in mouth, this is normal.  Use the Promethazine DM cough syrup at bedtime as needed for cough congestion.  This medication will make you drowsy.  If you develop any new or worsening symptoms please return for reevaluation or see your primary care provider.     ED Prescriptions     Medication Sig Dispense Auth. Provider   benzonatate (TESSALON) 100 MG capsule Take 2 capsules (200 mg total) by mouth every 8 (eight) hours. 21 capsule Margarette Canada, NP   ipratropium (ATROVENT) 0.06 % nasal spray Place 2 sprays into both nostrils 4 (four) times daily. 15 mL Margarette Canada, NP   promethazine-dextromethorphan (PROMETHAZINE-DM) 6.25-15 MG/5ML syrup Take 5 mLs by mouth 4 (four) times daily as needed. 118 mL Margarette Canada, NP   oseltamivir (TAMIFLU) 75 MG capsule Take 1 capsule (75 mg total) by mouth every 12 (twelve) hours. 10 capsule Margarette Canada, NP      PDMP not reviewed this encounter.   Margarette Canada, NP 11/10/22 [phone]

## 2023-07-24 ENCOUNTER — Ambulatory Visit: Payer: Managed Care, Other (non HMO) | Admitting: Obstetrics and Gynecology

## 2023-07-24 ENCOUNTER — Encounter: Payer: Self-pay | Admitting: Obstetrics and Gynecology

## 2023-07-24 VITALS — BP 105/68 | HR 93 | Ht 63.0 in | Wt 165.0 lb

## 2023-07-24 DIAGNOSIS — Z01419 Encounter for gynecological examination (general) (routine) without abnormal findings: Secondary | ICD-10-CM | POA: Diagnosis not present

## 2023-07-24 DIAGNOSIS — Z0000 Encounter for general adult medical examination without abnormal findings: Secondary | ICD-10-CM

## 2023-07-24 DIAGNOSIS — Z3041 Encounter for surveillance of contraceptive pills: Secondary | ICD-10-CM

## 2023-07-24 DIAGNOSIS — Z13220 Encounter for screening for lipoid disorders: Secondary | ICD-10-CM

## 2023-07-24 MED ORDER — LEVONORGESTREL-ETHINYL ESTRAD 0.15-30 MG-MCG PO TABS: 1.0000 | ORAL_TABLET | Freq: Every day | ORAL | 3 refills | Status: DC

## 2023-07-24 NOTE — Patient Instructions (Signed)
I value your feedback and you entrusting us with your care. If you get a [facility] patient survey, I would appreciate you taking the time to let us know about your experience today. Thank you! ? ? ?

## 2023-07-24 NOTE — Progress Notes (Signed)
PCP:  Patient, No Pcp Per   Chief Complaint  Patient presents with   Gynecologic Exam    No concerns     HPI:      Ms. Jill Ramos is a 32 y.o. G1P1001 whose LMP was Patient's last menstrual period was 07/10/2023 (exact date)., presents today for her annual examination.  Her menses are regular every 28-30 days, lasting 3-4 days on OCPs.  Dysmenorrhea none. She does not have intermenstrual bleeding.  Sex activity: single partner, contraception - OCP (estrogen/progesterone). No pain/bleeding.  Last Pap: 07/02/22  Results were: no abnormalities  Hx of STDs: chlamydia in past  There is no FH of breast cancer. There is no FH of ovarian cancer. The patient does self-breast exams.  Tobacco use: The patient denies current or previous tobacco use. Alcohol use: none No drug use.  Exercise: moderately active  She does get adequate calcium and Vitamin D in her diet. Did 1 of Gardasil but never did other 2. Declines for now.  Borderline LDL on labs 2023 for health form; needs health form completed again this yr  Past Medical History:  Diagnosis Date   Preeclampsia    STD (sexually transmitted disease) 06/2014   chlamydia    Past Surgical History:  Procedure Laterality Date   NO PAST SURGERIES      Family History  Problem Relation Age of Onset   Diabetes Maternal Grandmother    Hypertension Maternal Grandmother    Diabetes Paternal Grandmother    Heart disease Paternal Grandfather    Stomach cancer Cousin     Social History   Socioeconomic History   Marital status: Single    Spouse name: Not on file   Number of children: 1   Years of education: 16   Highest education level: Not on file  Occupational History   Occupation: case Financial controller    Comment: [employer]  Tobacco Use   Smoking status: Never   Smokeless tobacco: Never  Vaping Use   Vaping status: Never Used  Substance and Sexual Activity   Alcohol use: No    Alcohol/week: 0.0 standard drinks of alcohol   Drug  use: No   Sexual activity: Yes    Partners: Male    Birth control/protection: Pill, Condom  Other Topics Concern   Not on file  Social History Narrative   Not on file   Social Determinants of Health   Financial Resource Strain: Not on file  Food Insecurity: Not on file  Transportation Needs: Not on file  Physical Activity: Not on file  Stress: Not on file  Social Connections: Not on file  Intimate Partner Violence: Not on file     Current Outpatient Medications:    levonorgestrel-ethinyl estradiol (ALTAVERA) 0.15-30 MG-MCG tablet, Take 1 tablet by mouth daily., Disp: 84 tablet, Rfl: 3     ROS:  Review of Systems  Constitutional:  Negative for fatigue, fever and unexpected weight change.  Respiratory:  Negative for cough, shortness of breath and wheezing.   Cardiovascular:  Negative for chest pain, palpitations and leg swelling.  Gastrointestinal:  Negative for blood in stool, constipation, diarrhea, nausea and vomiting.  Endocrine: Negative for cold intolerance, heat intolerance and polyuria.  Genitourinary:  Negative for dyspareunia, dysuria, flank pain, frequency, genital sores, hematuria, menstrual problem, pelvic pain, urgency, vaginal bleeding, vaginal discharge and vaginal pain.  Musculoskeletal:  Negative for back pain, joint swelling and myalgias.  Skin:  Negative for rash.  Neurological:  Negative for dizziness, syncope, light-headedness,  numbness and headaches.  Hematological:  Negative for adenopathy.  Psychiatric/Behavioral:  Negative for agitation, confusion, sleep disturbance and suicidal ideas. The patient is not nervous/anxious.   BREAST: No symptoms   Objective: BP 105/68   Pulse 93   Ht 5\' 3"  (1.6 m)   Wt 165 lb (74.8 kg)   LMP 07/10/2023 (Exact Date)   BMI 29.23 kg/m    Physical Exam Constitutional:      Appearance: She is well-developed.  Genitourinary:     Vulva normal.     Right Labia: No rash, tenderness or lesions.    Left Labia: No  tenderness, lesions or rash.    No vaginal discharge, erythema or tenderness.      Right Adnexa: not tender and no mass present.    Left Adnexa: not tender and no mass present.    No cervical friability or polyp.     Uterus is not enlarged or tender.  Breasts:    Right: No mass, nipple discharge, skin change or tenderness.     Left: No mass, nipple discharge, skin change or tenderness.  Neck:     Thyroid: No thyromegaly.  Cardiovascular:     Rate and Rhythm: Normal rate and regular rhythm.     Heart sounds: Normal heart sounds. No murmur heard. Pulmonary:     Effort: Pulmonary effort is normal.     Breath sounds: Normal breath sounds.  Abdominal:     Palpations: Abdomen is soft.     Tenderness: There is no abdominal tenderness. There is no guarding or rebound.  Musculoskeletal:        General: Normal range of motion.     Cervical back: Normal range of motion.  Lymphadenopathy:     Cervical: No cervical adenopathy.  Neurological:     General: No focal deficit present.     Mental Status: She is alert and oriented to person, place, and time.     Cranial Nerves: No cranial nerve deficit.  Skin:    General: Skin is warm and dry.  Psychiatric:        Mood and Affect: Mood normal.        Behavior: Behavior normal.        Thought Content: Thought content normal.        Judgment: Judgment normal.  Vitals reviewed.     Assessment/Plan: Encounter for annual routine gynecological examination  Encounter for surveillance of contraceptive pills - Plan: levonorgestrel-ethinyl estradiol (ALTAVERA) 0.15-30 MG-MCG tablet; OCP Rx RF  Blood tests for routine general physical examination - Plan: Comprehensive metabolic panel, Lipid panel  Screening cholesterol level - Plan: Lipid panel  Health form completed and given to pt; pt can send copy of labs from her MyChart with form  Meds ordered this encounter  Medications   levonorgestrel-ethinyl estradiol (ALTAVERA) 0.15-30 MG-MCG tablet     Sig: Take 1 tablet by mouth daily.    Dispense:  84 tablet    Refill:  3    Order Specific Question:   Supervising Provider    Answer:   Waymon Budge            GYN counsel adequate intake of calcium and vitamin D, diet and exercise     F/U  Return in about 1 year (around 07/23/2024).  Jill Matsuoka B. Yazlynn Birkeland, PA-C 07/24/2023 2:46 PM

## 2023-07-25 ENCOUNTER — Other Ambulatory Visit: Payer: Managed Care, Other (non HMO)

## 2023-07-25 DIAGNOSIS — Z13220 Encounter for screening for lipoid disorders: Secondary | ICD-10-CM

## 2023-07-25 DIAGNOSIS — Z0000 Encounter for general adult medical examination without abnormal findings: Secondary | ICD-10-CM

## 2023-07-26 LAB — COMPREHENSIVE METABOLIC PANEL
ALT: 9 [IU]/L (ref 0–32)
AST: 14 [IU]/L (ref 0–40)
Albumin: 4.2 g/dL (ref 4.0–5.0)
Alkaline Phosphatase: 56 [IU]/L (ref 44–121)
BUN/Creatinine Ratio: 14 (ref 9–23)
BUN: 11 mg/dL (ref 6–20)
Bilirubin Total: 0.3 mg/dL (ref 0.0–1.2)
CO2: 22 mmol/L (ref 20–29)
Calcium: 9.3 mg/dL (ref 8.7–10.2)
Chloride: 103 mmol/L (ref 96–106)
Creatinine, Ser: 0.76 mg/dL (ref 0.57–1.00)
Globulin, Total: 2.9 g/dL (ref 1.5–4.5)
Glucose: 72 mg/dL (ref 70–99)
Potassium: 4.3 mmol/L (ref 3.5–5.2)
Sodium: 137 mmol/L (ref 134–144)
Total Protein: 7.1 g/dL (ref 6.0–8.5)
eGFR: 108 mL/min/{1.73_m2} (ref >59)

## 2023-07-26 LAB — LIPID PANEL
Chol/HDL Ratio: 4.9 ratio — ABNORMAL HIGH (ref 0.0–4.4)
Cholesterol, Total: 192 mg/dL (ref 100–199)
HDL: 39 mg/dL — ABNORMAL LOW (ref >39)
LDL Chol Calc (NIH): 140 mg/dL — ABNORMAL HIGH (ref 0–99)
Triglycerides: 73 mg/dL (ref 0–149)
VLDL Cholesterol Cal: 13 mg/dL (ref 5–40)

## 2023-07-29 ENCOUNTER — Encounter: Payer: Self-pay | Admitting: Obstetrics and Gynecology

## 2023-09-21 ENCOUNTER — Other Ambulatory Visit: Payer: Self-pay | Admitting: Obstetrics and Gynecology

## 2023-09-21 DIAGNOSIS — Z3041 Encounter for surveillance of contraceptive pills: Secondary | ICD-10-CM

## 2024-07-26 NOTE — Progress Notes (Unsigned)
 PCP:  Patient, No Pcp Per   No chief complaint on file.    HPI:      Ms. Jill Ramos is a 33 y.o. G1P1001 whose LMP was No LMP recorded., presents today for her annual examination.  Her menses are regular every 28-30 days, lasting 3-4 days on OCPs.  Dysmenorrhea none. She does not have intermenstrual bleeding.  Sex activity: single partner, contraception - OCP (estrogen/progesterone). No pain/bleeding.  Last Pap: 07/02/22  Results were: no abnormalities  Hx of STDs: chlamydia in past  There is no FH of breast cancer. There is no FH of ovarian cancer. The patient does self-breast exams.  Tobacco use: The patient denies current or previous tobacco use. Alcohol use: none No drug use.  Exercise: moderately active  She does get adequate calcium and Vitamin D in her diet. Did 1 of Gardasil but never did other 2. Declines for now.  Borderline LDL on labs 2023/2024 for health form; needs health form completed again this yr  Past Medical History:  Diagnosis Date   Preeclampsia    STD (sexually transmitted disease) 06/2014   chlamydia    Past Surgical History:  Procedure Laterality Date   NO PAST SURGERIES      Family History  Problem Relation Age of Onset   Diabetes Maternal Grandmother    Hypertension Maternal Grandmother    Diabetes Paternal Grandmother    Heart disease Paternal Grandfather    Stomach cancer Cousin     Social History   Socioeconomic History   Marital status: Single    Spouse name: Not on file   Number of children: 1   Years of education: 16   Highest education level: Not on file  Occupational History   Occupation: case financial controller    Comment: [employer]  Tobacco Use   Smoking status: Never   Smokeless tobacco: Never  Vaping Use   Vaping status: Never Used  Substance and Sexual Activity   Alcohol use: No    Alcohol/week: 0.0 standard drinks of alcohol   Drug use: No   Sexual activity: Yes    Partners: Male    Birth control/protection: Pill,  Condom  Other Topics Concern   Not on file  Social History Narrative   Not on file   Social Drivers of Health   Financial Resource Strain: Not on file  Food Insecurity: Not on file  Transportation Needs: Not on file  Physical Activity: Not on file  Stress: Not on file  Social Connections: Not on file  Intimate Partner Violence: Not on file     Current Outpatient Medications:    levonorgestrel -ethinyl estradiol (ALTAVERA) 0.15-30 MG-MCG tablet, Take 1 tablet by mouth daily., Disp: 84 tablet, Rfl: 3     ROS:  Review of Systems  Constitutional:  Negative for fatigue, fever and unexpected weight change.  Respiratory:  Negative for cough, shortness of breath and wheezing.   Cardiovascular:  Negative for chest pain, palpitations and leg swelling.  Gastrointestinal:  Negative for blood in stool, constipation, diarrhea, nausea and vomiting.  Endocrine: Negative for cold intolerance, heat intolerance and polyuria.  Genitourinary:  Negative for dyspareunia, dysuria, flank pain, frequency, genital sores, hematuria, menstrual problem, pelvic pain, urgency, vaginal bleeding, vaginal discharge and vaginal pain.  Musculoskeletal:  Negative for back pain, joint swelling and myalgias.  Skin:  Negative for rash.  Neurological:  Negative for dizziness, syncope, light-headedness, numbness and headaches.  Hematological:  Negative for adenopathy.  Psychiatric/Behavioral:  Negative for agitation, confusion,  sleep disturbance and suicidal ideas. The patient is not nervous/anxious.   BREAST: No symptoms   Objective: There were no vitals taken for this visit.   Physical Exam Constitutional:      Appearance: She is well-developed.  Genitourinary:     Vulva normal.     Right Labia: No rash, tenderness or lesions.    Left Labia: No tenderness, lesions or rash.    No vaginal discharge, erythema or tenderness.      Right Adnexa: not tender and no mass present.    Left Adnexa: not tender and  no mass present.    No cervical friability or polyp.     Uterus is not enlarged or tender.  Breasts:    Right: No mass, nipple discharge, skin change or tenderness.     Left: No mass, nipple discharge, skin change or tenderness.  Neck:     Thyroid: No thyromegaly.  Cardiovascular:     Rate and Rhythm: Normal rate and regular rhythm.     Heart sounds: Normal heart sounds. No murmur heard. Pulmonary:     Effort: Pulmonary effort is normal.     Breath sounds: Normal breath sounds.  Abdominal:     Palpations: Abdomen is soft.     Tenderness: There is no abdominal tenderness. There is no guarding or rebound.  Musculoskeletal:        General: Normal range of motion.     Cervical back: Normal range of motion.  Lymphadenopathy:     Cervical: No cervical adenopathy.  Neurological:     General: No focal deficit present.     Mental Status: She is alert and oriented to person, place, and time.     Cranial Nerves: No cranial nerve deficit.  Skin:    General: Skin is warm and dry.  Psychiatric:        Mood and Affect: Mood normal.        Behavior: Behavior normal.        Thought Content: Thought content normal.        Judgment: Judgment normal.  Vitals reviewed.     Assessment/Plan: Encounter for annual routine gynecological examination  Encounter for surveillance of contraceptive pills - Plan: levonorgestrel -ethinyl estradiol (ALTAVERA) 0.15-30 MG-MCG tablet; OCP Rx RF  Blood tests for routine general physical examination - Plan: Comprehensive metabolic panel, Lipid panel  Screening cholesterol level - Plan: Lipid panel  Health form completed and given to pt; pt can send copy of labs from her MyChart with form  No orders of the defined types were placed in this encounter.           GYN counsel adequate intake of calcium and vitamin D, diet and exercise     F/U  No follow-ups on file.  Angeles Paolucci B. Jakeel Starliper, PA-C 07/26/2024 1:13 PM

## 2024-07-27 ENCOUNTER — Other Ambulatory Visit (HOSPITAL_COMMUNITY)
Admission: RE | Admit: 2024-07-27 | Discharge: 2024-07-27 | Disposition: A | Discharge status: Home / Self Care | Source: Ambulatory Visit | Attending: Obstetrics and Gynecology | Admitting: Obstetrics and Gynecology

## 2024-07-27 ENCOUNTER — Encounter: Payer: Self-pay | Admitting: Obstetrics and Gynecology

## 2024-07-27 ENCOUNTER — Ambulatory Visit (INDEPENDENT_AMBULATORY_CARE_PROVIDER_SITE_OTHER): Admitting: Obstetrics and Gynecology

## 2024-07-27 VITALS — BP 100/67 | HR 86 | Ht 63.0 in | Wt 163.0 lb

## 2024-07-27 DIAGNOSIS — Z124 Encounter for screening for malignant neoplasm of cervix: Secondary | ICD-10-CM | POA: Insufficient documentation

## 2024-07-27 DIAGNOSIS — Z13220 Encounter for screening for lipoid disorders: Secondary | ICD-10-CM

## 2024-07-27 DIAGNOSIS — Z131 Encounter for screening for diabetes mellitus: Secondary | ICD-10-CM

## 2024-07-27 DIAGNOSIS — Z1151 Encounter for screening for human papillomavirus (HPV): Secondary | ICD-10-CM | POA: Insufficient documentation

## 2024-07-27 DIAGNOSIS — Z01419 Encounter for gynecological examination (general) (routine) without abnormal findings: Secondary | ICD-10-CM | POA: Diagnosis not present

## 2024-07-27 DIAGNOSIS — Z3041 Encounter for surveillance of contraceptive pills: Secondary | ICD-10-CM

## 2024-07-27 DIAGNOSIS — Z0000 Encounter for general adult medical examination without abnormal findings: Secondary | ICD-10-CM

## 2024-07-27 MED ORDER — LEVONORGESTREL-ETHINYL ESTRAD 0.15-30 MG-MCG PO TABS: 1.0000 | ORAL_TABLET | Freq: Every day | ORAL | 3 refills | Status: AC

## 2024-07-27 NOTE — Patient Instructions (Signed)
 I value your feedback and you entrusting [place] with your care. If you get a [place] patient survey, I would appreciate you taking the time to let us know about your experience today. Thank you! ? ? ?

## 2024-07-28 LAB — COMPREHENSIVE METABOLIC PANEL WITH GFR
ALT: 8 IU/L (ref 0–32)
AST: 15 IU/L (ref 0–40)
Albumin: 4.2 g/dL (ref 3.9–4.9)
Alkaline Phosphatase: 55 IU/L (ref 41–116)
BUN/Creatinine Ratio: 12 (ref 9–23)
BUN: 9 mg/dL (ref 6–20)
Bilirubin Total: 0.4 mg/dL (ref 0.0–1.2)
CO2: 19 mmol/L — ABNORMAL LOW (ref 20–29)
Calcium: 9.5 mg/dL (ref 8.7–10.2)
Chloride: 105 mmol/L (ref 96–106)
Creatinine, Ser: 0.75 mg/dL (ref 0.57–1.00)
Globulin, Total: 3.3 g/dL (ref 1.5–4.5)
Glucose: 74 mg/dL (ref 70–99)
Potassium: 4.1 mmol/L (ref 3.5–5.2)
Sodium: 138 mmol/L (ref 134–144)
Total Protein: 7.5 g/dL (ref 6.0–8.5)
eGFR: 109 mL/min/1.73 (ref >59)

## 2024-07-28 LAB — LIPID PANEL
Chol/HDL Ratio: 5 ratio — ABNORMAL HIGH (ref 0.0–4.4)
Cholesterol, Total: 208 mg/dL — ABNORMAL HIGH (ref 100–199)
HDL: 42 mg/dL (ref >39)
LDL Chol Calc (NIH): 154 mg/dL — ABNORMAL HIGH (ref 0–99)
Triglycerides: 68 mg/dL (ref 0–149)
VLDL Cholesterol Cal: 12 mg/dL (ref 5–40)

## 2024-07-28 LAB — CYTOLOGY - PAP
Comment: NEGATIVE
Diagnosis: NEGATIVE
High risk HPV: NEGATIVE

## 2024-07-28 LAB — HEMOGLOBIN A1C
Est. average glucose Bld gHb Est-mCnc: 88 mg/dL
Hgb A1c MFr Bld: 4.7 % — ABNORMAL LOW (ref 4.8–5.6)

## 2024-07-29 ENCOUNTER — Ambulatory Visit: Payer: Self-pay | Admitting: Obstetrics and Gynecology

## 2025-01-05 ENCOUNTER — Ambulatory Visit: Admitting: Family Medicine
